# Patient Record
Sex: Male | Born: 1990
Health system: Southern US, Community
[De-identification: ages and names within clinical notes are randomized; demographics above are authoritative.]

## PROBLEM LIST (undated history)

## (undated) DIAGNOSIS — E161 Other hypoglycemia: Secondary | ICD-10-CM

## (undated) DIAGNOSIS — F419 Anxiety disorder, unspecified: Secondary | ICD-10-CM

## (undated) DIAGNOSIS — G43109 Migraine with aura, not intractable, without status migrainosus: Secondary | ICD-10-CM

## (undated) DIAGNOSIS — I10 Essential (primary) hypertension: Secondary | ICD-10-CM

## (undated) DIAGNOSIS — F32A Depression, unspecified: Secondary | ICD-10-CM

## (undated) DIAGNOSIS — M109 Gout, unspecified: Secondary | ICD-10-CM

## (undated) DIAGNOSIS — J45909 Unspecified asthma, uncomplicated: Secondary | ICD-10-CM

## (undated) DIAGNOSIS — K219 Gastro-esophageal reflux disease without esophagitis: Secondary | ICD-10-CM

## (undated) DIAGNOSIS — F329 Major depressive disorder, single episode, unspecified: Secondary | ICD-10-CM

## (undated) HISTORY — DX: Anxiety disorder, unspecified: F41.9

## (undated) HISTORY — DX: Depression, unspecified: F32.A

## (undated) HISTORY — PX: TONSILLECTOMY: SUR1361

## (undated) HISTORY — DX: Other hypoglycemia: E16.1

## (undated) HISTORY — DX: Morbid (severe) obesity due to excess calories: E66.01

## (undated) HISTORY — DX: Unspecified asthma, uncomplicated: J45.909

## (undated) HISTORY — DX: Migraine with aura, not intractable, without status migrainosus: G43.109

## (undated) HISTORY — PX: WISDOM TOOTH EXTRACTION: SHX21

---
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
## 2012-10-25 ENCOUNTER — Encounter: Payer: Self-pay | Admitting: *Deleted

## 2012-10-26 ENCOUNTER — Encounter: Payer: Self-pay | Admitting: Nurse Practitioner

## 2012-10-26 ENCOUNTER — Ambulatory Visit (INDEPENDENT_AMBULATORY_CARE_PROVIDER_SITE_OTHER): Payer: BC Managed Care – PPO | Admitting: Nurse Practitioner

## 2012-10-26 VITALS — BP 130/80 | HR 70 | Ht 72.0 in | Wt 338.0 lb

## 2012-10-26 DIAGNOSIS — F418 Other specified anxiety disorders: Secondary | ICD-10-CM

## 2012-10-26 DIAGNOSIS — E161 Other hypoglycemia: Secondary | ICD-10-CM

## 2012-10-26 DIAGNOSIS — F341 Dysthymic disorder: Secondary | ICD-10-CM

## 2012-10-26 MED ORDER — ESCITALOPRAM OXALATE 10 MG PO TABS: 10.0000 mg | ORAL_TABLET | Freq: Every day | ORAL | Status: DC

## 2012-10-26 MED ORDER — METFORMIN HCL 500 MG PO TABS: 500.0000 mg | ORAL_TABLET | Freq: Two times a day (BID) | ORAL | Status: DC

## 2012-10-30 ENCOUNTER — Encounter: Payer: Self-pay | Admitting: Nurse Practitioner

## 2012-10-30 DIAGNOSIS — F418 Other specified anxiety disorders: Secondary | ICD-10-CM | POA: Insufficient documentation

## 2012-10-30 DIAGNOSIS — F419 Anxiety disorder, unspecified: Secondary | ICD-10-CM | POA: Insufficient documentation

## 2012-10-30 DIAGNOSIS — F32A Depression, unspecified: Secondary | ICD-10-CM | POA: Insufficient documentation

## 2012-10-30 DIAGNOSIS — E161 Other hypoglycemia: Secondary | ICD-10-CM | POA: Insufficient documentation

## 2012-10-30 NOTE — Assessment & Plan Note (Signed)
Restart metformin as directed. Discussed importance of weight loss and activity.

## 2012-10-30 NOTE — Progress Notes (Signed)
Subjective:  Presents with his brother for complaints of fatigue for the past few months. Sleeping 8-9 hours a night. Hypersomnia at times. Emotional lability. Some social isolation. Limited exercise/activity. Denies any alcohol or drug use. Diet is not very healthy overall. Would like some assistance in helping him lose weight. Has a history of hyperinsulinemia. Denies suicidal thoughts or ideation. No evidence of sleep apnea.  Objective:   BP 130/80  Pulse 70  Ht 6' (1.829 m)  Wt 338 lb (153.316 kg)  BMI 45.83 kg/m2 NAD. Alert, oriented. Lungs clear. Heart regular rate rhythm. Thyroid normal limit to palpation and nontender. Large waist circumference.  Assessment:Hyperinsulinemia  Depression with anxiety  Morbid obesity  Plan: Meds ordered this encounter  Medications  . escitalopram (LEXAPRO) 10 MG tablet    Sig: Take 1 tablet (10 mg total) by mouth daily.    Dispense:  30 tablet    Refill:  0    Order Specific Question:  Supervising Provider    Answer:  Merlyn Albert [[id number]]  . metFORMIN (GLUCOPHAGE) 500 MG tablet    Sig: Take 1 tablet (500 mg total) by mouth 2 (two) times daily with a meal.    Dispense:  60 tablet    Refill:  5    Order Specific Question:  Supervising Provider    Answer:  Merlyn Albert [[id number]]   discussed importance of regular exercise. Explained rationale for metformin and how this may help him with his efforts at weight loss. Patient defers mental health counseling at this point. Recheck in 3-4 weeks, call back sooner if any problems.

## 2012-10-30 NOTE — Assessment & Plan Note (Signed)
Start Lexapro 10 mg daily. Defers mental health counseling.

## 2012-11-27 ENCOUNTER — Ambulatory Visit: Payer: BC Managed Care – PPO | Admitting: Family Medicine

## 2012-11-30 ENCOUNTER — Encounter: Payer: Self-pay | Admitting: Nurse Practitioner

## 2012-11-30 ENCOUNTER — Ambulatory Visit (INDEPENDENT_AMBULATORY_CARE_PROVIDER_SITE_OTHER): Payer: BC Managed Care – PPO | Admitting: Nurse Practitioner

## 2012-11-30 VITALS — BP 124/90 | HR 80 | Ht 72.0 in | Wt 331.0 lb

## 2012-11-30 DIAGNOSIS — F341 Dysthymic disorder: Secondary | ICD-10-CM

## 2012-11-30 DIAGNOSIS — F418 Other specified anxiety disorders: Secondary | ICD-10-CM

## 2012-11-30 DIAGNOSIS — E161 Other hypoglycemia: Secondary | ICD-10-CM

## 2012-11-30 MED ORDER — ESCITALOPRAM OXALATE 20 MG PO TABS: 20.0000 mg | ORAL_TABLET | Freq: Every day | ORAL | Status: DC

## 2012-12-03 ENCOUNTER — Ambulatory Visit (HOSPITAL_COMMUNITY): Payer: BC Managed Care – PPO | Admitting: Psychiatry

## 2012-12-04 ENCOUNTER — Encounter: Payer: Self-pay | Admitting: Nurse Practitioner

## 2012-12-04 NOTE — Assessment & Plan Note (Signed)
ncrease Lexapro to 20 mg daily. Given information on local mental health counselor, strongly encouraged to seek counseling. Continue metformin as directed. Continue his weight loss efforts. Increase activity. Recheck in 3 months, call back sooner if any problems.   

## 2012-12-04 NOTE — Assessment & Plan Note (Signed)
ncrease Lexapro to 20 mg daily. Given information on local mental health counselor, strongly encouraged to seek counseling. Continue metformin as directed. Continue his weight loss efforts. Increase activity. Recheck in 3 months, call back sooner if any problems.

## 2012-12-04 NOTE — Progress Notes (Signed)
Subjective:  Presents for followup. Has seen significant improvement in his previous symptoms on Lexapro 10 mg daily. Still having some anger issues at times. Also some social awkwardness and anxiety. Denies suicidal or homicidal thoughts or ideation. No major side effects on Lexapro. Has been taking his metformin as directed. Doing well with his weight loss.  Objective:   BP 124/90  Pulse 80  Ht 6' (1.829 m)  Wt 331 lb (150.141 kg)  BMI 44.88 kg/m2 NAD. Alert, oriented. Thoughts logical coherent and relevant. Calm affect. Lungs clear. Heart regular rate rhythm.  Assessment:Depression with anxiety  Hyperinsulinemia  Morbid obesity  Plan: Increase Lexapro to 20 mg daily. Given information on local mental health counselor, strongly encouraged to seek counseling. Continue metformin as directed. Continue his weight loss efforts. Increase activity. Recheck in 3 months, call back sooner if any problems.

## 2012-12-06 ENCOUNTER — Ambulatory Visit (HOSPITAL_COMMUNITY): Payer: BC Managed Care – PPO | Admitting: Psychiatry

## 2013-07-11 ENCOUNTER — Encounter: Payer: Self-pay | Admitting: Family Medicine

## 2013-07-11 ENCOUNTER — Ambulatory Visit (INDEPENDENT_AMBULATORY_CARE_PROVIDER_SITE_OTHER): Payer: PRIVATE HEALTH INSURANCE | Admitting: Family Medicine

## 2013-07-11 VITALS — BP 138/98 | Ht 72.5 in | Wt 331.0 lb

## 2013-07-11 DIAGNOSIS — M549 Dorsalgia, unspecified: Secondary | ICD-10-CM

## 2013-07-11 MED ORDER — CHLORZOXAZONE 500 MG PO TABS: 500.0000 mg | ORAL_TABLET | Freq: Three times a day (TID) | ORAL | Status: DC

## 2013-07-11 MED ORDER — ETODOLAC 400 MG PO TABS: 400.0000 mg | ORAL_TABLET | Freq: Two times a day (BID) | ORAL | Status: DC

## 2013-07-11 NOTE — Patient Instructions (Signed)
Back Exercises Back exercises help treat and prevent back injuries. The goal of back exercises is to increase the strength of your abdominal and back muscles and the flexibility of your back. These exercises should be started when you no longer have back pain. Back exercises include:  Pelvic Tilt. Lie on your back with your knees bent. Tilt your pelvis until the lower part of your back is against the floor. Hold this position 5 to 10 sec and repeat 5 to 10 times.  Knee to Chest. Pull first 1 knee up against your chest and hold for 20 to 30 seconds, repeat this with the other knee, and then both knees. This may be done with the other leg straight or bent, whichever feels better.  Sit-Ups or Curl-Ups. Bend your knees 90 degrees. Start with tilting your pelvis, and do a partial, slow sit-up, lifting your trunk only 30 to 45 degrees off the floor. Take at least 2 to 3 seconds for each sit-up. Do not do sit-ups with your knees out straight. If partial sit-ups are difficult, simply do the above but with only tightening your abdominal muscles and holding it as directed.  Hip-Lift. Lie on your back with your knees flexed 90 degrees. Push down with your feet and shoulders as you raise your hips a couple inches off the floor; hold for 10 seconds, repeat 5 to 10 times.  Back arches. Lie on your stomach, propping yourself up on bent elbows. Slowly press on your hands, causing an arch in your low back. Repeat 3 to 5 times. Any initial stiffness and discomfort should lessen with repetition over time.  Shoulder-Lifts. Lie face down with arms beside your body. Keep hips and torso pressed to floor as you slowly lift your head and shoulders off the floor. Do not overdo your exercises, especially in the beginning. Exercises may cause you some mild back discomfort which lasts for a few minutes; however, if the pain is more severe, or lasts for more than 15 minutes, do not continue exercises until you see your caregiver.  Improvement with exercise therapy for back problems is slow.  See your caregivers for assistance with developing a proper back exercise program. Document Released: 07/14/2004 Document Revised: 08/29/2011 Document Reviewed: 04/07/2011 [employer] Patient Information 2014 [employer].  

## 2013-07-11 NOTE — Progress Notes (Signed)
   Subjective:    Patient ID: Jason [name], male    DOB: 04/21/1991, 23 y.o.   MRN: [id number]  Back Pain This is a new problem. The current episode started 1 to 4 weeks ago. The quality of the pain is described as shooting and aching. The pain does not radiate. Exacerbated by: walking. Stiffness is present in the morning. Treatments tried: tylenol. The treatment provided no relief.    Mid and lowere back worse on left  Day and night pain, bothersome,  Locking up, sudden spasms,,  Some radiation, shoots down, limps with it  Pos pain at night  Tylenol ibuprofen  Mo/s insurance    Review of Systems  Musculoskeletal: Positive for back pain.   no change in urinary or bowel habits no blood in stool ROS otherwise negative     Objective:   Physical Exam Alert and no major distress lungs clear. Heart regular rate rhythm. Left lower lumbar region tender to deep palpation negative straight leg raise. Spine nontender. Decent range of motion.       Assessment & Plan:  Impression subacute lumbar strain discussed at length plan anti-inflammatory medicine prescribed muscle spasm medicine prescribed local measures discussed. Exercise long-term encourage. WSL

## 2013-09-16 ENCOUNTER — Emergency Department (HOSPITAL_COMMUNITY): Payer: PRIVATE HEALTH INSURANCE

## 2013-09-16 ENCOUNTER — Encounter (HOSPITAL_COMMUNITY): Payer: Self-pay | Admitting: Emergency Medicine

## 2013-09-16 ENCOUNTER — Emergency Department (HOSPITAL_COMMUNITY)
Admission: EM | Admit: 2013-09-16 | Discharge: 2013-09-16 | Disposition: A | Discharge status: Home / Self Care | Payer: PRIVATE HEALTH INSURANCE | Attending: Emergency Medicine | Admitting: Emergency Medicine

## 2013-09-16 DIAGNOSIS — Z8679 Personal history of other diseases of the circulatory system: Secondary | ICD-10-CM | POA: Insufficient documentation

## 2013-09-16 DIAGNOSIS — M25569 Pain in unspecified knee: Secondary | ICD-10-CM | POA: Insufficient documentation

## 2013-09-16 DIAGNOSIS — M25561 Pain in right knee: Secondary | ICD-10-CM

## 2013-09-16 DIAGNOSIS — J45909 Unspecified asthma, uncomplicated: Secondary | ICD-10-CM | POA: Insufficient documentation

## 2013-09-16 DIAGNOSIS — M25469 Effusion, unspecified knee: Secondary | ICD-10-CM | POA: Insufficient documentation

## 2013-09-16 MED ORDER — HYDROCODONE-ACETAMINOPHEN 5-325 MG PO TABS
2.0000 | ORAL_TABLET | Freq: Once | ORAL | Status: AC
  Administered 2013-09-16: 2 via ORAL
  Filled 2013-09-16: qty 2

## 2013-09-16 MED ORDER — KETOROLAC TROMETHAMINE 10 MG PO TABS
10.0000 mg | ORAL_TABLET | Freq: Once | ORAL | Status: AC
  Administered 2013-09-16: 10 mg via ORAL
  Filled 2013-09-16: qty 1

## 2013-09-16 MED ORDER — DICLOFENAC SODIUM 75 MG PO TBEC: 75.0000 mg | DELAYED_RELEASE_TABLET | Freq: Two times a day (BID) | ORAL | Status: DC

## 2013-09-16 MED ORDER — HYDROCODONE-ACETAMINOPHEN 5-325 MG PO TABS: 1.0000 | ORAL_TABLET | ORAL | Status: DC | PRN

## 2013-09-16 NOTE — ED Notes (Signed)
Pain rt knee, x 3 weeks, No known injury.  Says that he stood up and leg felt like it would "give way".

## 2013-09-16 NOTE — ED Notes (Signed)
Knee pain x 2.5 weeks. Pt states intermittent swelling to knee. No known injury

## 2013-09-16 NOTE — ED Provider Notes (Signed)
CSN: [id number]     Arrival date & time 09/16/13  1303 History   First MD Initiated Contact with Patient 09/16/13 1522  This chart was scribed for non-physician practitioner, Ivery Quale, working with Flint Melter, MD by Marica Otter, ED Scribe. This patient was seen in room APFT21/APFT21 and the patient's care was started at 3:27 PM.  Chief Complaint  Patient presents with  . Knee Pain   The history is provided by the patient. No language interpreter was used.   HPI Comments: Jason [name] is a 23 y.o. male who presents to the Emergency Department complaining of right knee pain onset 2.5 weeks ago when he got home from work and his right leg felt like it would "give way." Pt reports he has not been able to walk without pain since then and putting any pressure on the right knee intensifies the right knee pain. Pt also complains of associated swelling of the right knee. 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 Pt denies any ankle pain. Pt also denies that the knee is hot to touch or any prior surgeries/injuries to the area.    Past Medical History  Diagnosis Date  . Asthma   . Morbid obesity   . Migraine with aura   . Hyperinsulinemia    Past Surgical History  Procedure Laterality Date  . Tonsillectomy     No family history on file. History  Substance Use Topics  . Smoking status: Never Smoker   . Smokeless tobacco: Not on file  . Alcohol Use: No     Comment: Rarely    Review of Systems  Cardiovascular: Positive for leg swelling ( right knee ).  Musculoskeletal: Positive for myalgias ( right knee pain).   A complete 10 system review of systems was obtained and all systems are negative except as noted in the HPI and PMH.   Allergies  Adipex-p  Home Medications  No current outpatient prescriptions on file. BP 146/89  Pulse 87  Temp(Src) 98.2 F (36.8 C) (Oral)  Resp 18  Ht 6' (1.829 m)  Wt 320 lb (145.151 kg)   BMI 43.39 kg/m2  SpO2 100% Physical Exam  Nursing note and vitals reviewed. Constitutional: He is oriented to person, place, and time. He appears well-developed and well-nourished. No distress.  HENT:  Head: Normocephalic and atraumatic.  Eyes: EOM are normal.  Neck: Neck supple. No tracheal deviation present.  Cardiovascular: Normal rate.   Pulmonary/Chest: Effort normal. No respiratory distress.  Musculoskeletal: Normal range of motion.  FROM of the hip. No deformity of the quadriceps. Patella is in the midline. No deformity of the anterior, tibial tuberosity. Soreness of the posterior right knee but no hematoma or mass. No tibial deformity. Achilles tendon on the right is intact. Dorsalis pedis 2+. At 80 degrees ankle there is pain at the lateral and medial right knee. Pain with flexion.    Neurological: He is alert and oriented to person, place, and time.  Skin: Skin is warm and dry.  Psychiatric: He has a normal mood and affect. His behavior is normal.    ED Course  Procedures (including critical care time) DIAGNOSTIC STUDIES: Oxygen Saturation is 100% on RA, normal by my interpretation.    COORDINATION OF CARE:  3:37 PM-Discussed treatment plan, which includes imaging results, possible causes of knee pain, and advising pt to follow up with an orthopedic specialist, orthopedic referral, placement of knee  immobilizer, treating the area with ice packs, resting the knee, antiinflammatory and pain meds with pt at bedside and pt agreed to plan. Pt was offered crutches, however, pt declined.   Labs Review Labs Reviewed - No data to display Imaging Review Dg Knee Complete 4 Views Right  09/16/2013   CLINICAL DATA:  Right knee pain.  EXAM: RIGHT KNEE - COMPLETE 4+ VIEW  COMPARISON:  None.  FINDINGS: There is no evidence of fracture, dislocation, or joint effusion. There is no evidence of arthropathy or other focal bone abnormality. Soft tissues are unremarkable.  IMPRESSION: Normal right  knee.   Electronically Signed   By: Irish LackGlenn  Yamagata M.D.   On: 09/16/2013 13:46     EKG Interpretation None      MDM Patient reports to have weeks of pain involving the right knee. The pain is worse when he is standing. The patient states that he does a lot of standing, pushing, pulling, bending, stooping. He has not had any previous operations or procedures involving the knee. No emergent changes noted involving the knee. Strongly suggested to the patient to be evaluated by orthopedics.    Final diagnoses:  None    *I have reviewed nursing notes, vital signs, and all appropriate lab and imaging results for this patient.** *scribI personally performed the services described in this documentation, which was scribed in my presence. The recorded information has been reviewed and is accurate.e  Jason DikeHobson M Shariya Gaster, PA-C 09/16/13 1615

## 2013-09-16 NOTE — Discharge Instructions (Signed)
The x-ray of your knee is negative for fracture or dislocation or effusion. Please see the orthopedic specialist listed above for additional evaluation concerning your knee. Please rest her knee is much as possible apply ice. Use diclofenac 2 times daily. May use Norco for more severe pain. This medication may cause drowsiness, please use with caution. Knee Pain Knee pain can be a result of an injury or other medical conditions. Treatment will depend on the cause of your pain. HOME CARE  Only take medicine as told by your doctor.  Keep a healthy weight. Being overweight can make the knee hurt more.  Stretch before exercising or playing sports.  If there is constant knee pain, change the way you exercise. Ask your doctor for advice.  Make sure shoes fit well. Choose the right shoe for the sport or activity.  Protect your knees. Wear kneepads if needed.  Rest when you are tired. GET HELP RIGHT AWAY IF:   Your knee pain does not stop.  Your knee pain does not get better.  Your knee joint feels hot to the touch.  You have a fever. MAKE SURE YOU:   Understand these instructions.  Will watch this condition.  Will get help right away if you are not doing well or get worse. Document Released: 09/02/2008 Document Revised: 08/29/2011 Document Reviewed: 09/02/2008 [facility] Patient Information 2014 [place], [place].

## 2013-09-17 NOTE — ED Provider Notes (Signed)
Medical screening examination/treatment/procedure(s) were performed by non-physician practitioner and as supervising physician I was immediately available for consultation/collaboration.  Marco Adelson L Ikia Cincotta, MD 09/17/13 0013 

## 2013-10-16 ENCOUNTER — Other Ambulatory Visit: Payer: Self-pay | Admitting: Orthopedic Surgery

## 2013-10-16 DIAGNOSIS — M25561 Pain in right knee: Secondary | ICD-10-CM

## 2013-10-19 ENCOUNTER — Other Ambulatory Visit: Payer: Self-pay

## 2013-10-27 ENCOUNTER — Ambulatory Visit
Admission: RE | Admit: 2013-10-27 | Discharge: 2013-10-27 | Disposition: A | Discharge status: Home / Self Care | Payer: PRIVATE HEALTH INSURANCE | Source: Ambulatory Visit | Attending: Orthopedic Surgery | Admitting: Orthopedic Surgery

## 2013-10-27 DIAGNOSIS — M25561 Pain in right knee: Secondary | ICD-10-CM

## 2014-01-20 ENCOUNTER — Telehealth: Payer: Self-pay | Admitting: *Deleted

## 2014-01-20 NOTE — Telephone Encounter (Signed)
Open in error

## 2014-02-20 ENCOUNTER — Telehealth: Payer: Self-pay | Admitting: Family Medicine

## 2014-02-20 NOTE — Telephone Encounter (Signed)
Ntsw, reasonable if in distress

## 2014-02-20 NOTE — Telephone Encounter (Signed)
Patients thinks his blood pressure is running high and wants an appointment for tomorrow afternoon.  He has no way to check it because his arms are too big for [employer] cuffs, but he said that he has been having a lot of light headed spells, vision blurry and headaches. Please advise.

## 2014-02-20 NOTE — Telephone Encounter (Signed)
Patient concerned about blood pressure. Patient scheduled office visit for tommorow-ER tonight if worse.

## 2014-02-21 ENCOUNTER — Ambulatory Visit: Payer: PRIVATE HEALTH INSURANCE | Admitting: Family Medicine

## 2014-08-06 ENCOUNTER — Encounter (HOSPITAL_COMMUNITY): Payer: Self-pay

## 2014-08-06 ENCOUNTER — Emergency Department (HOSPITAL_COMMUNITY)
Admission: EM | Admit: 2014-08-06 | Discharge: 2014-08-06 | Disposition: A | Discharge status: Home / Self Care | Payer: PRIVATE HEALTH INSURANCE | Attending: Emergency Medicine | Admitting: Emergency Medicine

## 2014-08-06 DIAGNOSIS — J45909 Unspecified asthma, uncomplicated: Secondary | ICD-10-CM | POA: Insufficient documentation

## 2014-08-06 DIAGNOSIS — R109 Unspecified abdominal pain: Secondary | ICD-10-CM | POA: Diagnosis present

## 2014-08-06 DIAGNOSIS — K529 Noninfective gastroenteritis and colitis, unspecified: Secondary | ICD-10-CM | POA: Diagnosis not present

## 2014-08-06 DIAGNOSIS — Z8679 Personal history of other diseases of the circulatory system: Secondary | ICD-10-CM | POA: Insufficient documentation

## 2014-08-06 DIAGNOSIS — Z791 Long term (current) use of non-steroidal anti-inflammatories (NSAID): Secondary | ICD-10-CM | POA: Insufficient documentation

## 2014-08-06 MED ORDER — ONDANSETRON 8 MG PO TBDP
8.0000 mg | ORAL_TABLET | Freq: Once | ORAL | Status: AC
  Administered 2014-08-06: 8 mg via ORAL
  Filled 2014-08-06: qty 1

## 2014-08-06 MED ORDER — PROMETHAZINE HCL 25 MG PO TABS
25.0000 mg | ORAL_TABLET | Freq: Four times a day (QID) | ORAL | Status: DC | PRN
Start: 2014-08-06 — End: 2015-07-06

## 2014-08-06 MED ORDER — IBUPROFEN 800 MG PO TABS: 800.0000 mg | ORAL_TABLET | Freq: Three times a day (TID) | ORAL | Status: DC

## 2014-08-06 MED ORDER — KETOROLAC TROMETHAMINE 60 MG/2ML IM SOLN
60.0000 mg | Freq: Once | INTRAMUSCULAR | Status: AC
  Administered 2014-08-06: 60 mg via INTRAMUSCULAR
  Filled 2014-08-06: qty 2

## 2014-08-06 MED ORDER — ONDANSETRON HCL 4 MG PO TABS: 4.0000 mg | ORAL_TABLET | Freq: Three times a day (TID) | ORAL | Status: DC | PRN

## 2014-08-06 NOTE — Discharge Instructions (Signed)
Please call your doctor for a followup appointment within 24-48 hours. When you talk to your doctor please let them know that you were seen in the emergency department and have them acquire all of your records so that they can discuss the findings with you and formulate a treatment plan to fully care for your new and ongoing problems. ° °

## 2014-08-06 NOTE — ED Notes (Signed)
Pt states he started having abd pain with vomiting and diarrha approx 1 hour ago, has had 3 diarrhea stools in that period

## 2014-08-06 NOTE — ED Provider Notes (Signed)
CSN: [id number]     Arrival date & time 08/06/14  0120 History   First MD Initiated Contact with Patient 08/06/14 0131     Chief Complaint  Patient presents with  . Abdominal Pain     (Consider location/radiation/quality/duration/timing/severity/associated sxs/prior Treatment) HPI Comments: 24 year old male, presents with a complaint of vomiting diarrhea and abdominal pain that started 1 hour ago. He reports 3 episodes of diarrhea which she describes as both watery and loose and one episode of vomiting. He has associated epigastric cramping. His significant other has the exact same symptoms. Nothing makes this better or worse, not associated with bloody emesis or bloody diarrhea. Not associated with any abnormal food intake, he endorses having a hamburger and french fries for dinner. There has been no complaints of headache, sore throat, cough, shortness of breath, back pain, swelling, rash.  Patient is a 24 y.o. male presenting with abdominal pain. The history is provided by the patient and a relative.  Abdominal Pain   Past Medical History  Diagnosis Date  . Asthma   . Morbid obesity   . Migraine with aura   . Hyperinsulinemia    Past Surgical History  Procedure Laterality Date  . Tonsillectomy     No family history on file. History  Substance Use Topics  . Smoking status: Never Smoker   . Smokeless tobacco: Not on file  . Alcohol Use: No     Comment: Rarely    Review of Systems  Gastrointestinal: Positive for abdominal pain.  All other systems reviewed and are negative.     Allergies  Adipex-p  Home Medications   Prior to Admission medications   Medication Sig Start Date End Date Taking? Authorizing Provider  diclofenac (VOLTAREN) 75 MG EC tablet Take 1 tablet (75 mg total) by mouth 2 (two) times daily. 09/16/13   Kathie DikeHobson M Bryant, PA-C  HYDROcodone-acetaminophen (NORCO/VICODIN) 5-325 MG per tablet Take 1 tablet by mouth every 4 (four) hours as needed for moderate  pain. 09/16/13   Kathie DikeHobson M Bryant, PA-C  ibuprofen (ADVIL,MOTRIN) 800 MG tablet Take 1 tablet (800 mg total) by mouth 3 (three) times daily. 08/06/14   Vida RollerBrian D Pammy Vesey, MD  ondansetron (ZOFRAN) 4 MG tablet Take 1 tablet (4 mg total) by mouth every 8 (eight) hours as needed for nausea or vomiting. 08/06/14   Vida RollerBrian D Yazmine Sorey, MD  promethazine (PHENERGAN) 25 MG tablet Take 1 tablet (25 mg total) by mouth every 6 (six) hours as needed for nausea or vomiting. 08/06/14   Vida RollerBrian D Delitha Elms, MD   BP 131/76 mmHg  Pulse 77  Temp(Src) 98 F (36.7 C) (Oral)  Resp 20  Ht 6\' 4"  (1.93 m)  Wt 330 lb (149.687 kg)  BMI 40.19 kg/m2  SpO2 97% Physical Exam  Constitutional: He appears well-developed and well-nourished. No distress.  HENT:  Head: Normocephalic and atraumatic.  Mouth/Throat: Oropharynx is clear and moist. No oropharyngeal exudate.  Eyes: Conjunctivae and EOM are normal. Pupils are equal, round, and reactive to light. Right eye exhibits no discharge. Left eye exhibits no discharge. No scleral icterus.  Neck: Normal range of motion. Neck supple. No JVD present. No thyromegaly present.  Cardiovascular: Normal rate, regular rhythm, normal heart sounds and intact distal pulses.  Exam reveals no gallop and no friction rub.   No murmur heard. Pulmonary/Chest: Effort normal and breath sounds normal. No respiratory distress. He has no wheezes. He has no rales.  Abdominal: Soft. Bowel sounds are normal. He exhibits no distension and  no mass. There is tenderness ( Minimal epigastric tenderness).  Musculoskeletal: Normal range of motion. He exhibits no edema or tenderness.  Lymphadenopathy:    He has no cervical adenopathy.  Neurological: He is alert. Coordination normal.  Skin: Skin is warm and dry. No rash noted. No erythema.  Psychiatric: He has a normal mood and affect. His behavior is normal.  Nursing note and vitals reviewed.   ED Course  Procedures (including critical care time) Labs Review Labs  Reviewed - No data to display  Imaging Review No results found.    MDM   Final diagnoses:  Gastroenteritis    The patient's symptoms are almost exactly the same as his significant others. They have not had any suspicious food intake though they did ate the same dinner 6 hours prior to symptoms. This could just as easily be a stomach virus, overall the patient appears stable, he will receive intramuscular Toradol and oral Zofran, stable for discharge. He has been informed that his symptoms will likely last a couple of days.   Pt given meds, stable for d/c, can f/u outpt.  Encouraged BRAT diet and ongoing hydration  Meds given in ED:  Medications  ketorolac (TORADOL) injection 60 mg (60 mg Intramuscular Given 08/06/14 0152)  ondansetron (ZOFRAN-ODT) disintegrating tablet 8 mg (8 mg Oral Given 08/06/14 0152)    New Prescriptions   IBUPROFEN (ADVIL,MOTRIN) 800 MG TABLET    Take 1 tablet (800 mg total) by mouth 3 (three) times daily.   ONDANSETRON (ZOFRAN) 4 MG TABLET    Take 1 tablet (4 mg total) by mouth every 8 (eight) hours as needed for nausea or vomiting.   PROMETHAZINE (PHENERGAN) 25 MG TABLET    Take 1 tablet (25 mg total) by mouth every 6 (six) hours as needed for nausea or vomiting.        Vida Roller, MD 08/06/14 [phone]

## 2014-11-05 ENCOUNTER — Encounter: Payer: Self-pay | Admitting: Family Medicine

## 2014-11-05 ENCOUNTER — Ambulatory Visit (INDEPENDENT_AMBULATORY_CARE_PROVIDER_SITE_OTHER): Payer: PRIVATE HEALTH INSURANCE | Admitting: Family Medicine

## 2014-11-05 VITALS — BP 124/84 | Ht 76.0 in | Wt 353.0 lb

## 2014-11-05 DIAGNOSIS — L989 Disorder of the skin and subcutaneous tissue, unspecified: Secondary | ICD-10-CM

## 2014-11-05 DIAGNOSIS — R21 Rash and other nonspecific skin eruption: Secondary | ICD-10-CM | POA: Diagnosis not present

## 2014-11-05 NOTE — Progress Notes (Signed)
   Subjective:    Patient ID: Jason [name], male    DOB: 04/25/1991, 24 y.o.   MRN: [id number]  HPI  Patient here for pain in upper left arm where a knot has been for several years. Patient also concerned about a second knot on lower left arm.   progressively larger lump, sens to the sun  Also addtnl knowt has cropped u  Hx of melanoma in the in laws so pt uptight about it    Patient also concerned about not urinating frequently although he drinks often. Long term tendency. Drinks more water than usual, started , rekatively light, notes no noctutria   [phone] Review of Systems No headache no chest pain no back pain no abdominal pain no change in bowel habits    Objective:   Physical Exam  Alert no acute distress vital stable left upper arm small probable dermatofibroma. Left forearm cyst noted      Assessment & Plan:  Impression multiple skin lesions of concern to patient. Likely benign discussed #2 diminished urinary frequency likely within normal limits plan patient to bring back urinalysis. Dermatology referral. Maceo ProWSL

## 2014-11-06 ENCOUNTER — Telehealth: Payer: Self-pay | Admitting: *Deleted

## 2014-11-06 NOTE — Telephone Encounter (Signed)
Pt seen by Dr.Steve on 5/18. Was unable to get a urine specimen. Pt to bring back specimen and bring back to office. Dip and spin. Pt's number is [phone]

## 2014-11-10 ENCOUNTER — Telehealth: Payer: Self-pay

## 2014-11-10 NOTE — Telephone Encounter (Signed)
See telephone message on 11/06/14. Patient dropped off urine. Urine has been dipped and spun. PH- 6.0, SG- 1.025. Everything else was negative.

## 2014-11-11 NOTE — Telephone Encounter (Signed)
Notify pt urine fine

## 2014-11-11 NOTE — Telephone Encounter (Signed)
[facility] (voicemail not setup), card mailed notifying patient of results

## 2014-12-03 ENCOUNTER — Encounter (HOSPITAL_COMMUNITY): Payer: Self-pay

## 2014-12-03 ENCOUNTER — Emergency Department (HOSPITAL_COMMUNITY)
Admission: EM | Admit: 2014-12-03 | Discharge: 2014-12-03 | Disposition: A | Discharge status: Home / Self Care | Payer: PRIVATE HEALTH INSURANCE | Attending: Emergency Medicine | Admitting: Emergency Medicine

## 2014-12-03 DIAGNOSIS — B341 Enterovirus infection, unspecified: Secondary | ICD-10-CM | POA: Diagnosis not present

## 2014-12-03 DIAGNOSIS — Z791 Long term (current) use of non-steroidal anti-inflammatories (NSAID): Secondary | ICD-10-CM | POA: Insufficient documentation

## 2014-12-03 DIAGNOSIS — R21 Rash and other nonspecific skin eruption: Secondary | ICD-10-CM | POA: Diagnosis present

## 2014-12-03 DIAGNOSIS — Z8679 Personal history of other diseases of the circulatory system: Secondary | ICD-10-CM | POA: Insufficient documentation

## 2014-12-03 DIAGNOSIS — J45909 Unspecified asthma, uncomplicated: Secondary | ICD-10-CM | POA: Insufficient documentation

## 2014-12-03 DIAGNOSIS — J029 Acute pharyngitis, unspecified: Secondary | ICD-10-CM | POA: Insufficient documentation

## 2014-12-03 NOTE — ED Provider Notes (Signed)
CSN: [id number]     Arrival date & time 12/03/14  1853 History   First MD Initiated Contact with Patient 12/03/14 1919     Chief Complaint  Patient presents with  . Rash     (Consider location/radiation/quality/duration/timing/severity/associated sxs/prior Treatment) Patient is a 24 y.o. male presenting with rash. The history is provided by the patient.  Rash Location:  Mouth, hand and foot Mouth rash location:  Tongue, upper gingiva and lower gingiva Hand rash location:  R hand and L hand Foot rash location:  L foot and R foot Quality: redness   Severity:  Moderate Onset quality:  Gradual Duration:  2 days Timing:  Constant Progression:  Worsening Chronicity:  New Relieved by:  Nothing Worsened by:  Nothing tried Ineffective treatments:  None tried Associated symptoms: sore throat    Jason [name] is a 24 y.o. male who presents to the ED with a rash to his hand and feet and in his mouth. He denies any other problems. He states that a couple weeks ago a kid in the neighborhood had hand, foot, mouth disease but he did not have really close contact with him.   Past Medical History  Diagnosis Date  . Asthma   . Morbid obesity   . Migraine with aura   . Hyperinsulinemia    Past Surgical History  Procedure Laterality Date  . Tonsillectomy     History reviewed. No pertinent family history. History  Substance Use Topics  . Smoking status: Never Smoker   . Smokeless tobacco: Never Used  . Alcohol Use: 0.0 oz/week    0 Standard drinks or equivalent per week     Comment: occasional    Review of Systems  HENT: Positive for sore throat.   Skin: Positive for rash.  all other systems negative    Allergies  Adipex-p  Home Medications   Prior to Admission medications   Medication Sig Start Date End Date Taking? Authorizing Provider  diclofenac (VOLTAREN) 75 MG EC tablet Take 1 tablet (75 mg total) by mouth 2 (two) times daily. 09/16/13   Ivery Quale, PA-C   HYDROcodone-acetaminophen (NORCO/VICODIN) 5-325 MG per tablet Take 1 tablet by mouth every 4 (four) hours as needed for moderate pain. Patient not taking: Reported on 11/05/2014 09/16/13   Ivery Quale, PA-C  ibuprofen (ADVIL,MOTRIN) 800 MG tablet Take 1 tablet (800 mg total) by mouth 3 (three) times daily. 08/06/14   Eber Hong, MD  ondansetron (ZOFRAN) 4 MG tablet Take 1 tablet (4 mg total) by mouth every 8 (eight) hours as needed for nausea or vomiting. 08/06/14   Eber Hong, MD  promethazine (PHENERGAN) 25 MG tablet Take 1 tablet (25 mg total) by mouth every 6 (six) hours as needed for nausea or vomiting. 08/06/14   Eber Hong, MD   BP 141/81 mmHg  Pulse 90  Temp(Src) 98.1 F (36.7 C) (Oral)  Resp 24  Ht 6' (1.829 m)  Wt 330 lb (149.687 kg)  BMI 44.75 kg/m2  SpO2 100% Physical Exam  Constitutional: He is oriented to person, place, and time. He appears well-developed and well-nourished. No distress.  HENT:  Head: Normocephalic.  Mouth/Throat: Uvula is midline.  Ulcer lesions to the posterior pharynx and soft palate. Few areas on tongue.   Eyes: EOM are normal.  Neck: Neck supple.  Cardiovascular: Normal rate.   Pulmonary/Chest: Effort normal.  Musculoskeletal: Normal range of motion.  See skin exam  Lymphadenopathy:    He has cervical adenopathy.  Neurological: He  is alert and oriented to person, place, and time. No cranial nerve deficit.  Skin: Skin is warm and dry.  Small red circular lesions plantar aspect of feet and right hand, few areas left hand.   Psychiatric: He has a normal mood and affect. His behavior is normal.  Nursing note and vitals reviewed.   ED Course  Procedures (including critical care time) Dr. Juleen China in to examine the patient and agrees viral rash most consistent with Coxsackie virus  Labs Review  MDM  24 y.o. male with rash to hands, feet and oral lesions. Stable for d/c without fever and does not appear toxic. Discussed with the patient  clinical findings and plan of care. All questioned fully answered. He will return if any problems arise.   Final diagnoses:  Coxsackie virus infection      Janne Napoleon, NP 12/04/14 0113  Raeford Razor, MD 12/05/14 1055

## 2014-12-03 NOTE — ED Provider Notes (Signed)
Medical screening examination/treatment/procedure(s) were conducted as a shared visit with non-physician practitioner(s) and myself.  I personally evaluated the patient during the encounter.   EKG Interpretation None     23yM with what I suspect is hand, foot & mouth although I have never personally seen in a patient this age. Erythematous papules to b/l feet, R hand and some soft palate lesions. Typical time of year.   Appears well. Low suspicion for emergent process. Symptomatic tx at this time.   Raeford Razor, MD 12/03/14 [phone]

## 2014-12-03 NOTE — ED Notes (Signed)
Patient c/o rash to feet, hands and mouth with pain

## 2014-12-03 NOTE — Discharge Instructions (Signed)
Take tylenol and ibuprofen for pain. Use salt water gargles and use Chloraseptic spray as needed for pain. Return as needed for worsening symptoms

## 2014-12-17 ENCOUNTER — Emergency Department (HOSPITAL_COMMUNITY)
Admission: EM | Admit: 2014-12-17 | Discharge: 2014-12-17 | Disposition: A | Discharge status: Home / Self Care | Payer: PRIVATE HEALTH INSURANCE | Attending: Emergency Medicine | Admitting: Emergency Medicine

## 2014-12-17 ENCOUNTER — Encounter (HOSPITAL_COMMUNITY): Payer: Self-pay

## 2014-12-17 ENCOUNTER — Emergency Department (HOSPITAL_COMMUNITY): Payer: PRIVATE HEALTH INSURANCE

## 2014-12-17 DIAGNOSIS — Z8679 Personal history of other diseases of the circulatory system: Secondary | ICD-10-CM | POA: Insufficient documentation

## 2014-12-17 DIAGNOSIS — J45909 Unspecified asthma, uncomplicated: Secondary | ICD-10-CM | POA: Diagnosis not present

## 2014-12-17 DIAGNOSIS — Y92410 Unspecified street and highway as the place of occurrence of the external cause: Secondary | ICD-10-CM | POA: Diagnosis not present

## 2014-12-17 DIAGNOSIS — Y9339 Activity, other involving climbing, rappelling and jumping off: Secondary | ICD-10-CM | POA: Diagnosis not present

## 2014-12-17 DIAGNOSIS — Y998 Other external cause status: Secondary | ICD-10-CM | POA: Diagnosis not present

## 2014-12-17 DIAGNOSIS — Z7901 Long term (current) use of anticoagulants: Secondary | ICD-10-CM | POA: Insufficient documentation

## 2014-12-17 DIAGNOSIS — S99912A Unspecified injury of left ankle, initial encounter: Secondary | ICD-10-CM | POA: Diagnosis present

## 2014-12-17 DIAGNOSIS — S93402A Sprain of unspecified ligament of left ankle, initial encounter: Secondary | ICD-10-CM | POA: Insufficient documentation

## 2014-12-17 MED ORDER — HYDROCODONE-ACETAMINOPHEN 5-325 MG PO TABS: 1.0000 | ORAL_TABLET | ORAL | Status: DC | PRN

## 2014-12-17 MED ORDER — KETOROLAC TROMETHAMINE 10 MG PO TABS
10.0000 mg | ORAL_TABLET | Freq: Once | ORAL | Status: AC
Start: 2014-12-17 — End: 2014-12-17
  Administered 2014-12-17: 10 mg via ORAL
  Filled 2014-12-17: qty 1

## 2014-12-17 MED ORDER — MELOXICAM 15 MG PO TABS: 15.0000 mg | ORAL_TABLET | Freq: Every day | ORAL | Status: DC

## 2014-12-17 NOTE — Discharge Instructions (Signed)
Your x-ray is negative for fracture, however there seems to be some fluid in the joint. Please see Dr. Romeo AppleHarrison, or the orthopedic specialist of your choice as sone as possible for additional orthopedic evaluation of this finding. Please keep your foot elevated above your waist. Please apply ice. Please use crutches until you can safely apply weight to your lower extremity. Please use of mobile daily with food. May use Norco for more severe pain if needed. Norco may cause drowsiness, please do not operate machinery, drink alcohol, operate a vehicle, or but is patent in activities that require concentration while taking this medication. Ankle Sprain An ankle sprain is an injury to the strong, fibrous tissues (ligaments) that hold the bones of your ankle joint together.  CAUSES An ankle sprain is usually caused by a fall or by twisting your ankle. Ankle sprains most commonly occur when you step on the outer edge of your foot, and your ankle turns inward. People who participate in sports are more prone to these types of injuries.  SYMPTOMS   Pain in your ankle. The pain may be present at rest or only when you are trying to stand or walk.  Swelling.  Bruising. Bruising may develop immediately or within 1 to 2 days after your injury.  Difficulty standing or walking, particularly when turning corners or changing directions. DIAGNOSIS  Your caregiver will ask you details about your injury and perform a physical exam of your ankle to determine if you have an ankle sprain. During the physical exam, your caregiver will press on and apply pressure to specific areas of your foot and ankle. Your caregiver will try to move your ankle in certain ways. An X-ray exam may be done to be sure a bone was not broken or a ligament did not separate from one of the bones in your ankle (avulsion fracture).  TREATMENT  Certain types of braces can help stabilize your ankle. Your caregiver can make a recommendation for this.  Your caregiver may recommend the use of medicine for pain. If your sprain is severe, your caregiver may refer you to a surgeon who helps to restore function to parts of your skeletal system (orthopedist) or a physical therapist. HOME CARE INSTRUCTIONS   Apply ice to your injury for 1-2 days or as directed by your caregiver. Applying ice helps to reduce inflammation and pain.  Put ice in a plastic bag.  Place a towel between your skin and the bag.  Leave the ice on for 15-20 minutes at a time, every 2 hours while you are awake.  Only take over-the-counter or prescription medicines for pain, discomfort, or fever as directed by your caregiver.  Elevate your injured ankle above the level of your heart as much as possible for 2-3 days.  If your caregiver recommends crutches, use them as instructed. Gradually put weight on the affected ankle. Continue to use crutches or a cane until you can walk without feeling pain in your ankle.  If you have a plaster splint, wear the splint as directed by your caregiver. Do not rest it on anything harder than a pillow for the first 24 hours. Do not put weight on it. Do not get it wet. You may take it off to take a shower or bath.  You may have been given an elastic bandage to wear around your ankle to provide support. If the elastic bandage is too tight (you have numbness or tingling in your foot or your foot becomes cold and  blue), adjust the bandage to make it comfortable.  If you have an air splint, you may blow more air into it or let air out to make it more comfortable. You may take your splint off at night and before taking a shower or bath. Wiggle your toes in the splint several times per day to decrease swelling. SEEK MEDICAL CARE IF:   You have rapidly increasing bruising or swelling.  Your toes feel extremely cold or you lose feeling in your foot.  Your pain is not relieved with medicine. SEEK IMMEDIATE MEDICAL CARE IF:  Your toes are numb or  blue.  You have severe pain that is increasing. MAKE SURE YOU:   Understand these instructions.  Will watch your condition.  Will get help right away if you are not doing well or get worse. Document Released: 06/06/2005 Document Revised: 02/29/2012 Document Reviewed: 06/18/2011 [facility] Patient Information 2015 [place], [place]. This information is not intended to replace advice given to you by your health care provider. Make sure you discuss any questions you have with your health care provider.

## 2014-12-17 NOTE — ED Notes (Signed)
Pt reports 3 weeks ago he jumped off of the back of a truck and hurt left ankle.  C/O pain and  Swelling since then.

## 2014-12-17 NOTE — ED Provider Notes (Signed)
CSN: [id number]     Arrival date & time 12/17/14  [id number] History   First MD Initiated Contact with Patient 12/17/14 0945     Chief Complaint  Patient presents with  . Ankle Pain     (Consider location/radiation/quality/duration/timing/severity/associated sxs/prior Treatment) Patient is a 24 y.o. male presenting with ankle pain. The history is provided by the patient.  Ankle Pain Location:  Hip and ankle Time since incident:  3 weeks Injury: yes   Mechanism of injury comment:  Jumped off the back of a truck Ankle location:  L ankle Pain details:    Quality:  Aching   Severity:  Moderate   Duration:  3 weeks   Timing:  Intermittent   Progression:  Worsening Chronicity:  New Dislocation: no   Foreign body present:  No foreign bodies Prior injury to area:  No Relieved by:  Nothing Worsened by:  Bearing weight Ineffective treatments:  Acetaminophen Associated symptoms: swelling   Associated symptoms: no numbness and no tingling   Risk factors: no frequent fractures and no known bone disorder     Past Medical History  Diagnosis Date  . Asthma   . Morbid obesity   . Migraine with aura   . Hyperinsulinemia    Past Surgical History  Procedure Laterality Date  . Tonsillectomy     No family history on file. History  Substance Use Topics  . Smoking status: Never Smoker   . Smokeless tobacco: Never Used  . Alcohol Use: 0.0 oz/week    0 Standard drinks or equivalent per week     Comment: occasional    Review of Systems  Musculoskeletal: Positive for arthralgias.  All other systems reviewed and are negative.     Allergies  Adipex-p  Home Medications   Prior to Admission medications   Medication Sig Start Date End Date Taking? Authorizing Provider  diclofenac (VOLTAREN) 75 MG EC tablet Take 1 tablet (75 mg total) by mouth 2 (two) times daily. 09/16/13   Ivery QualeHobson Jaylia Pettus, PA-C  HYDROcodone-acetaminophen (NORCO/VICODIN) 5-325 MG per tablet Take 1 tablet by mouth every 4  (four) hours as needed. 12/17/14   Ivery QualeHobson Maher Shon, PA-C  ibuprofen (ADVIL,MOTRIN) 800 MG tablet Take 1 tablet (800 mg total) by mouth 3 (three) times daily. 08/06/14   Eber HongBrian Miller, MD  meloxicam (MOBIC) 15 MG tablet Take 1 tablet (15 mg total) by mouth daily. 12/17/14   Ivery QualeHobson Dayanara Sherrill, PA-C  ondansetron (ZOFRAN) 4 MG tablet Take 1 tablet (4 mg total) by mouth every 8 (eight) hours as needed for nausea or vomiting. 08/06/14   Eber HongBrian Miller, MD  promethazine (PHENERGAN) 25 MG tablet Take 1 tablet (25 mg total) by mouth every 6 (six) hours as needed for nausea or vomiting. 08/06/14   Eber HongBrian Miller, MD   BP 136/86 mmHg  Pulse 86  Temp(Src) 97.7 F (36.5 C) (Oral)  Resp 14  Ht 6' (1.829 m)  Wt 325 lb (147.419 kg)  BMI 44.07 kg/m2  SpO2 100% Physical Exam  Constitutional: He is oriented to person, place, and time. He appears well-developed and well-nourished.  Non-toxic appearance.  HENT:  Head: Normocephalic.  Right Ear: Tympanic membrane and external ear normal.  Left Ear: Tympanic membrane and external ear normal.  Eyes: EOM and lids are normal. Pupils are equal, round, and reactive to light.  Neck: Normal range of motion. Neck supple. Carotid bruit is not present.  Cardiovascular: Normal rate, regular rhythm, normal heart sounds, intact distal pulses and normal pulses.   Pulmonary/Chest:  Breath sounds normal. No respiratory distress.  Abdominal: Soft. Bowel sounds are normal. There is no tenderness. There is no guarding.  Musculoskeletal:       Left ankle: He exhibits decreased range of motion and swelling. He exhibits no deformity. Tenderness. Lateral malleolus tenderness found. Achilles tendon normal.  Lymphadenopathy:       Head (right side): No submandibular adenopathy present.       Head (left side): No submandibular adenopathy present.    He has no cervical adenopathy.  Neurological: He is alert and oriented to person, place, and time. He has normal strength. No cranial nerve deficit or  sensory deficit.  Skin: Skin is warm and dry.  Psychiatric: He has a normal mood and affect. His speech is normal.  Nursing note and vitals reviewed.   ED Course  Procedures (including critical care time) Labs Review Labs Reviewed - No data to display  Imaging Review Dg Ankle Complete Left  12/17/2014   CLINICAL DATA:  Patient jumped out of a truck and has left ankle pain and swelling. Initial encounter.  EXAM: LEFT ANKLE COMPLETE - 3+ VIEW  COMPARISON:  None.  FINDINGS: Soft tissue swelling is seen about the ankle joint. There may be a small joint effusion. No acute osseous abnormality.  IMPRESSION: Soft tissue swelling and probable small joint effusion. No definite fracture.   Electronically Signed   By: Leanna Battles M.D.   On: 12/17/2014 09:52     EKG Interpretation None      MDM  Vital signs are well within normal limits. Review of the x-ray of the left ankle shows no acute fracture or dislocation. There is a questionable small joint effusion present.  I discussed the findings on the examination as well as the findings on the x-ray with the patient in terms which he understands. The plan at this time is for an ankle stirrup splint, crutches, ice, elevation, and orthopedic evaluation for possible ligament injury. The patient acknowledges this discharge plan and is in agreement.    Final diagnoses:  Ankle sprain, left, initial encounter    **I have reviewed nursing notes, vital signs, and all appropriate lab and imaging results for this patient.Ivery Quale, PA-C 12/17/14 1049  Donnetta Hutching, MD 12/19/14 [phone]

## 2015-03-09 ENCOUNTER — Encounter (HOSPITAL_COMMUNITY): Payer: Self-pay | Admitting: Emergency Medicine

## 2015-03-09 ENCOUNTER — Emergency Department (HOSPITAL_COMMUNITY)
Admission: EM | Admit: 2015-03-09 | Discharge: 2015-03-10 | Disposition: A | Discharge status: Home / Self Care | Payer: PRIVATE HEALTH INSURANCE | Attending: Emergency Medicine | Admitting: Emergency Medicine

## 2015-03-09 DIAGNOSIS — Z8669 Personal history of other diseases of the nervous system and sense organs: Secondary | ICD-10-CM | POA: Insufficient documentation

## 2015-03-09 DIAGNOSIS — R0789 Other chest pain: Secondary | ICD-10-CM | POA: Diagnosis not present

## 2015-03-09 DIAGNOSIS — J45901 Unspecified asthma with (acute) exacerbation: Secondary | ICD-10-CM | POA: Diagnosis not present

## 2015-03-09 DIAGNOSIS — R1012 Left upper quadrant pain: Secondary | ICD-10-CM | POA: Diagnosis not present

## 2015-03-09 DIAGNOSIS — R040 Epistaxis: Secondary | ICD-10-CM | POA: Insufficient documentation

## 2015-03-09 DIAGNOSIS — R079 Chest pain, unspecified: Secondary | ICD-10-CM | POA: Diagnosis present

## 2015-03-09 DIAGNOSIS — F419 Anxiety disorder, unspecified: Secondary | ICD-10-CM | POA: Insufficient documentation

## 2015-03-09 NOTE — ED Notes (Signed)
Pt c/o constant generalized chest pain x one month.
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
## 2015-03-10 LAB — CBC WITH DIFFERENTIAL/PLATELET
Basophils Absolute: 0 10*3/uL (ref 0.0–0.1)
Basophils Relative: 0 %
Eosinophils Absolute: 0.2 10*3/uL (ref 0.0–0.7)
Eosinophils Relative: 1 %
HCT: 42.2 % (ref 39.0–52.0)
Hemoglobin: 14.6 g/dL (ref 13.0–17.0)
Lymphocytes Relative: 25 %
Lymphs Abs: 4.1 10*3/uL — ABNORMAL HIGH (ref 0.7–4.0)
MCH: 28.3 pg (ref 26.0–34.0)
MCHC: 34.6 g/dL (ref 30.0–36.0)
MCV: 81.8 fL (ref 78.0–100.0)
Monocytes Absolute: 1.1 10*3/uL — ABNORMAL HIGH (ref 0.1–1.0)
Monocytes Relative: 7 %
Neutro Abs: 11.1 10*3/uL — ABNORMAL HIGH (ref 1.7–7.7)
Neutrophils Relative %: 67 %
Platelets: 290 10*3/uL (ref 150–400)
RBC: 5.16 MIL/uL (ref 4.22–5.81)
RDW: 12.9 % (ref 11.5–15.5)
WBC: 16.6 10*3/uL — ABNORMAL HIGH (ref 4.0–10.5)

## 2015-03-10 LAB — COMPREHENSIVE METABOLIC PANEL
ALT: 41 U/L (ref 17–63)
AST: 26 U/L (ref 15–41)
Albumin: 4.4 g/dL (ref 3.5–5.0)
Alkaline Phosphatase: 72 U/L (ref 38–126)
Anion gap: 6 (ref 5–15)
BUN: 9 mg/dL (ref 6–20)
CO2: 28 mmol/L (ref 22–32)
Calcium: 8.6 mg/dL — ABNORMAL LOW (ref 8.9–10.3)
Chloride: 105 mmol/L (ref 101–111)
Creatinine, Ser: 1.02 mg/dL (ref 0.61–1.24)
GFR calc Af Amer: >60 mL/min (ref >60)
GFR calc non Af Amer: >60 mL/min (ref >60)
Glucose, Bld: 99 mg/dL (ref 65–99)
Potassium: 3.8 mmol/L (ref 3.5–5.1)
Sodium: 139 mmol/L (ref 135–145)
Total Bilirubin: 0.6 mg/dL (ref 0.3–1.2)
Total Protein: 7.3 g/dL (ref 6.5–8.1)

## 2015-03-10 LAB — TROPONIN I: Troponin I: <0.03 ng/mL (ref <0.031)

## 2015-03-10 MED ORDER — NAPROXEN 250 MG PO TABS: 500.0000 mg | ORAL_TABLET | Freq: Once | ORAL | Status: DC

## 2015-03-10 MED ORDER — METHOCARBAMOL 500 MG PO TABS: ORAL_TABLET | ORAL | Status: DC

## 2015-03-10 MED ORDER — CYCLOBENZAPRINE HCL 10 MG PO TABS: 10.0000 mg | ORAL_TABLET | Freq: Once | ORAL | Status: DC

## 2015-03-10 MED ORDER — NAPROXEN 500 MG PO TABS: ORAL_TABLET | ORAL | Status: DC

## 2015-03-10 NOTE — Discharge Instructions (Signed)
Use ice and heat on your chest. Take the medications as prescribed. Follow up with Dr Gerda Diss about your blood pressure, but it has been normal in the ED. Follow up with Behavioral Health or [facility] to discuss your anxiety and to decide what treatment you may need. Return to the ED if you feel you might do something to harm yourself or someone else. You can see Dr Suszanne Conners, and ENT specialist, about your nose bleeds. Put an ice pack on your forehead when you have a nose bleed and pinch your nose like an old fashioned clothes pin to help stop the bleeding.    Emergency Department Resource Guide    [facility]         Address  Phone  Notes  [facility]  [phone]   Angie Fava, PhD [address] [place], [place]   [phone] or [phone]   [facility]   [address] [place], [place] [phone]   [facility] [address] [place], [place] [phone] Insurance/Medicaid/sponsorship through [facility] and Families [address]                                    [place], [place] [phone] Therapy/tele-psych/case  [facility] [address], [place] [phone]    Dr. Lolly Mustache  [phone]   [facility]  [employer] [facility] Dept. 1) [address] [place] 2) [address] [place] 3)  [address], [place] [phone]  [phone]   [facility] Child Abuse Hotline [phone] or [phone] (After Hours)

## 2015-07-03 ENCOUNTER — Ambulatory Visit (INDEPENDENT_AMBULATORY_CARE_PROVIDER_SITE_OTHER): Payer: PRIVATE HEALTH INSURANCE | Admitting: Nurse Practitioner

## 2015-07-03 ENCOUNTER — Encounter: Payer: Self-pay | Admitting: Nurse Practitioner

## 2015-07-03 VITALS — BP 122/90 | Ht 76.0 in | Wt 362.1 lb

## 2015-07-03 DIAGNOSIS — F418 Other specified anxiety disorders: Secondary | ICD-10-CM

## 2015-07-03 MED ORDER — ESCITALOPRAM OXALATE 20 MG PO TABS: 20.0000 mg | ORAL_TABLET | Freq: Every day | ORAL | Status: DC

## 2015-07-06 ENCOUNTER — Encounter: Payer: Self-pay | Admitting: Nurse Practitioner

## 2015-07-06 NOTE — Progress Notes (Signed)
Subjective:  Presents for complaints of a flareup of his depression and anxiety. Has been off medication for the past 2 years. Symptoms began 7-8 months ago when he split up with his girlfriend who is the mother of his baby. States he does fine when his child is with him, shares custody. Trouble sleeping. Social isolation. Agitation. Emotional lability. Denies suicidal or homicidal thoughts or ideation. Has done well on Lexapro in the past.  Objective:   BP 122/90 mmHg  Ht 6\' 4"  (1.93 m)  Wt 362 lb 2 oz (164.259 kg)  BMI 44.10 kg/m2 NAD. Alert, oriented. Calm affect. Thoughts logical coherent and relevant. Dressed appropriately. Lungs clear. Heart regular rate rhythm.  Assessment:  Problem List Items Addressed This Visit      Other   Depression with anxiety - Primary     Plan:  Meds ordered this encounter  Medications  . escitalopram (LEXAPRO) 20 MG tablet    Sig: Take 1 tablet (20 mg total) by mouth daily.    Dispense:  30 tablet    Refill:  2    Order Specific Question:  Supervising Provider    Answer:  Merlyn AlbertLUKING, WILLIAM S [[id number]]   Restart Lexapro as directed. Start with half tab by mouth daily for several days then if needed, increase to one tab. DC med and call if any adverse effects. Call back in 4-6 weeks if no improvement. Return in about 3 months (around 10/01/2015) for recheck.

## 2015-09-03 ENCOUNTER — Encounter: Payer: Self-pay | Admitting: Family Medicine

## 2015-10-02 ENCOUNTER — Ambulatory Visit: Payer: PRIVATE HEALTH INSURANCE | Admitting: Nurse Practitioner

## 2015-12-29 ENCOUNTER — Encounter (HOSPITAL_COMMUNITY): Payer: Self-pay | Admitting: *Deleted

## 2015-12-29 ENCOUNTER — Emergency Department (HOSPITAL_COMMUNITY)
Admission: EM | Admit: 2015-12-29 | Discharge: 2015-12-29 | Disposition: A | Discharge status: Home / Self Care | Payer: PRIVATE HEALTH INSURANCE | Attending: Emergency Medicine | Admitting: Emergency Medicine

## 2015-12-29 DIAGNOSIS — J45909 Unspecified asthma, uncomplicated: Secondary | ICD-10-CM | POA: Diagnosis not present

## 2015-12-29 DIAGNOSIS — F17210 Nicotine dependence, cigarettes, uncomplicated: Secondary | ICD-10-CM | POA: Diagnosis not present

## 2015-12-29 DIAGNOSIS — Z79899 Other long term (current) drug therapy: Secondary | ICD-10-CM | POA: Diagnosis not present

## 2015-12-29 DIAGNOSIS — H6093 Unspecified otitis externa, bilateral: Secondary | ICD-10-CM | POA: Diagnosis not present

## 2015-12-29 DIAGNOSIS — H6123 Impacted cerumen, bilateral: Secondary | ICD-10-CM | POA: Insufficient documentation

## 2015-12-29 DIAGNOSIS — I10 Essential (primary) hypertension: Secondary | ICD-10-CM | POA: Diagnosis not present

## 2015-12-29 DIAGNOSIS — H938X3 Other specified disorders of ear, bilateral: Secondary | ICD-10-CM | POA: Diagnosis present

## 2015-12-29 HISTORY — DX: Essential (primary) hypertension: I10

## 2015-12-29 MED ORDER — NEOMYCIN-POLYMYXIN-HC 3.5-10000-1 OT SUSP: 4.0000 [drp] | Freq: Three times a day (TID) | OTIC | Status: DC

## 2015-12-29 NOTE — ED Provider Notes (Signed)
CSN: [id number]     Arrival date & time 12/29/15  1406 History   First MD Initiated Contact with Patient 12/29/15 1510     Chief Complaint  Patient presents with  . Ear Fullness     (Consider location/radiation/quality/duration/timing/severity/associated sxs/prior Treatment) Patient is a 25 y.o. male presenting with plugged ear sensation. The history is provided by the patient. No language interpreter was used.  Ear Fullness This is a new problem. The current episode started in the past 7 days. The problem occurs constantly. The problem has been gradually worsening. Pertinent negatives include no fever. Nothing aggravates the symptoms. He has tried nothing for the symptoms. The treatment provided moderate relief.  Pt thinks he has swimmers ear  Past Medical History  Diagnosis Date  . Asthma   . Morbid obesity (HCC)   . Migraine with aura   . Hyperinsulinemia   . Hypertension    Past Surgical History  Procedure Laterality Date  . Tonsillectomy     History reviewed. No pertinent family history. Social History  Substance Use Topics  . Smoking status: Never Smoker   . Smokeless tobacco: Never Used  . Alcohol Use: 0.0 oz/week    0 Standard drinks or equivalent per week     Comment: occasional    Review of Systems  Constitutional: Negative for fever.  All other systems reviewed and are negative.     Allergies  Adipex-p  Home Medications   Prior to Admission medications   Medication Sig Start Date End Date Taking? Authorizing Provider  escitalopram (LEXAPRO) 20 MG tablet Take 1 tablet (20 mg total) by mouth daily. 07/03/15 07/02/16  Campbell Richesarolyn C Hoskins, NP   BP 149/82 mmHg  Pulse 100  Temp(Src) 98.1 F (36.7 C) (Oral)  Resp 18  Ht 6' (1.829 m)  Wt 145.151 kg  BMI 43.39 kg/m2  SpO2 97% Physical Exam  Constitutional: He is oriented to person, place, and time. He appears well-developed and well-nourished.  HENT:  Head: Normocephalic.  Wax bilat ears,  irrigatted   Erythema to canals  Eyes: EOM are normal.  Neck: Normal range of motion.  Pulmonary/Chest: Effort normal.  Abdominal: He exhibits no distension.  Musculoskeletal: Normal range of motion.  Neurological: He is alert and oriented to person, place, and time.  Psychiatric: He has a normal mood and affect.  Nursing note and vitals reviewed.   ED Course  Procedures (including critical care time) Labs Review Labs Reviewed - No data to display  Imaging Review No results found. I have personally reviewed and evaluated these images and lab results as part of my medical decision-making.   EKG Interpretation None      MDM TM's irrigatted with water and peroxide    Final diagnoses:  Cerumen impaction, bilateral  Otitis external, bilateral    Cortisporin otic    Elson AreasLeslie K Sofia, PA-C 12/29/15 1557  Lavera Guiseana Duo Liu, MD 12/30/15 1521

## 2015-12-29 NOTE — ED Notes (Signed)
Pt with ear fullness since swimming on Friday and began to have pain to bilateral ears on Sunday.  C/o dizziness as well

## 2015-12-29 NOTE — Discharge Instructions (Signed)
Otitis Externa Otitis externa is a bacterial or fungal infection of the outer ear canal. This is the area from the eardrum to the outside of the ear. Otitis externa is sometimes called "swimmer's ear." CAUSES  Possible causes of infection include:  Swimming in dirty water.  Moisture remaining in the ear after swimming or bathing.  Mild injury (trauma) to the ear.  Objects stuck in the ear (foreign body).  Cuts or scrapes (abrasions) on the outside of the ear. SIGNS AND SYMPTOMS  The first symptom of infection is often itching in the ear canal. Later signs and symptoms may include swelling and redness of the ear canal, ear pain, and yellowish-white fluid (pus) coming from the ear. The ear pain may be worse when pulling on the earlobe. DIAGNOSIS  Your health care provider will perform a physical exam. A sample of fluid may be taken from the ear and examined for bacteria or fungi. TREATMENT  Antibiotic ear drops are often given for 10 to 14 days. Treatment may also include pain medicine or corticosteroids to reduce itching and swelling. HOME CARE INSTRUCTIONS   Apply antibiotic ear drops to the ear canal as prescribed by your health care provider.  Take medicines only as directed by your health care provider.  If you have diabetes, follow any additional treatment instructions from your health care provider.  Keep all follow-up visits as directed by your health care provider. PREVENTION   Keep your ear dry. Use the corner of a towel to absorb water out of the ear canal after swimming or bathing.  Avoid scratching or putting objects inside your ear. This can damage the ear canal or remove the protective wax that lines the canal. This makes it easier for bacteria and fungi to grow.  Avoid swimming in lakes, polluted water, or poorly chlorinated pools.  You may use ear drops made of rubbing alcohol and vinegar after swimming. Combine equal parts of white vinegar and alcohol in a bottle.  Put 3 or 4 drops into each ear after swimming. SEEK MEDICAL CARE IF:   You have a fever.  Your ear is still red, swollen, painful, or draining pus after 3 days.  Your redness, swelling, or pain gets worse.  You have a severe headache.  You have redness, swelling, pain, or tenderness in the area behind your ear. MAKE SURE YOU:   Understand these instructions.  Will watch your condition.  Will get help right away if you are not doing well or get worse.   This information is not intended to replace advice given to you by your health care provider. Make sure you discuss any questions you have with your health care provider.   Document Released: 06/06/2005 Document Revised: 06/27/2014 Document Reviewed: 06/23/2011 Elsevier Interactive Patient Education 2016 Elsevier Inc. Cerumen Impaction The structures of the external ear canal secrete a waxy substance known as cerumen. Excess cerumen can build up in the ear canal, causing a condition known as cerumen impaction. Cerumen impaction can cause ear pain and disrupt the function of the ear. The rate of cerumen production differs for each individual. In certain individuals, the configuration of the ear canal may decrease his or her ability to naturally remove cerumen. CAUSES Cerumen impaction is caused by excessive cerumen production or buildup. RISK FACTORS  Frequent use of swabs to clean ears.  Having narrow ear canals.  Having eczema.  Being dehydrated. SIGNS AND SYMPTOMS  Diminished hearing.  Ear drainage.  Ear pain.  Ear itch. TREATMENT  Treatment may involve:  Over-the-counter or prescription ear drops to soften the cerumen.  Removal of cerumen by a health care provider. This may be done with:  Irrigation with warm water. This is the most common method of removal.  Ear curettes and other instruments.  Surgery. This may be done in severe cases. HOME CARE INSTRUCTIONS  Take medicines only as directed by your  health care provider.  Do not insert objects into the ear with the intent of cleaning the ear. PREVENTION  Do not insert objects into the ear, even with the intent of cleaning the ear. Removing cerumen as a part of normal hygiene is not necessary, and the use of swabs in the ear canal is not recommended.  Drink enough water to keep your urine clear or pale yellow.  Control your eczema if you have it. SEEK MEDICAL CARE IF:  You develop ear pain.  You develop bleeding from the ear.  The cerumen does not clear after you use ear drops as directed.   This information is not intended to replace advice given to you by your health care provider. Make sure you discuss any questions you have with your health care provider.   Document Released: 07/14/2004 Document Revised: 06/27/2014 Document Reviewed: 01/21/2015 Elsevier Interactive Patient Education [employer] Elsevier Inc.

## 2016-01-04 ENCOUNTER — Telehealth: Payer: Self-pay | Admitting: Family Medicine

## 2016-01-04 ENCOUNTER — Emergency Department (HOSPITAL_COMMUNITY)
Admission: EM | Admit: 2016-01-04 | Discharge: 2016-01-05 | Disposition: A | Discharge status: Home / Self Care | Payer: PRIVATE HEALTH INSURANCE | Attending: Emergency Medicine | Admitting: Emergency Medicine

## 2016-01-04 ENCOUNTER — Encounter (HOSPITAL_COMMUNITY): Payer: Self-pay | Admitting: *Deleted

## 2016-01-04 ENCOUNTER — Emergency Department (HOSPITAL_COMMUNITY): Payer: PRIVATE HEALTH INSURANCE

## 2016-01-04 DIAGNOSIS — M7989 Other specified soft tissue disorders: Secondary | ICD-10-CM | POA: Diagnosis present

## 2016-01-04 DIAGNOSIS — J45909 Unspecified asthma, uncomplicated: Secondary | ICD-10-CM | POA: Insufficient documentation

## 2016-01-04 DIAGNOSIS — M109 Gout, unspecified: Secondary | ICD-10-CM | POA: Diagnosis not present

## 2016-01-04 DIAGNOSIS — I10 Essential (primary) hypertension: Secondary | ICD-10-CM | POA: Diagnosis not present

## 2016-01-04 DIAGNOSIS — Z6841 Body Mass Index (BMI) 40.0 and over, adult: Secondary | ICD-10-CM | POA: Insufficient documentation

## 2016-01-04 DIAGNOSIS — R51 Headache: Secondary | ICD-10-CM | POA: Diagnosis not present

## 2016-01-04 LAB — CBC WITH DIFFERENTIAL/PLATELET
Basophils Absolute: 0 10*3/uL (ref 0.0–0.1)
Basophils Relative: 0 %
Eosinophils Absolute: 0.2 10*3/uL (ref 0.0–0.7)
Eosinophils Relative: 1 %
HCT: 45.4 % (ref 39.0–52.0)
Hemoglobin: 15.9 g/dL (ref 13.0–17.0)
Lymphocytes Relative: 19 %
Lymphs Abs: 3.2 10*3/uL (ref 0.7–4.0)
MCH: 28.8 pg (ref 26.0–34.0)
MCHC: 35 g/dL (ref 30.0–36.0)
MCV: 82.2 fL (ref 78.0–100.0)
Monocytes Absolute: 0.9 10*3/uL (ref 0.1–1.0)
Monocytes Relative: 5 %
Neutro Abs: 12.7 10*3/uL — ABNORMAL HIGH (ref 1.7–7.7)
Neutrophils Relative %: 75 %
Platelets: 287 10*3/uL (ref 150–400)
RBC: 5.52 MIL/uL (ref 4.22–5.81)
RDW: 12.9 % (ref 11.5–15.5)
WBC: 17 10*3/uL — ABNORMAL HIGH (ref 4.0–10.5)

## 2016-01-04 MED ORDER — OXYCODONE-ACETAMINOPHEN 5-325 MG PO TABS
1.0000 | ORAL_TABLET | Freq: Once | ORAL | Status: AC
  Administered 2016-01-04: 1 via ORAL
  Filled 2016-01-04: qty 1

## 2016-01-04 MED ORDER — ONDANSETRON 8 MG PO TBDP
8.0000 mg | ORAL_TABLET | Freq: Once | ORAL | Status: AC
  Administered 2016-01-04: 8 mg via ORAL
  Filled 2016-01-04: qty 1

## 2016-01-04 NOTE — ED Provider Notes (Signed)
CSN: [id number]     Arrival date & time 01/04/16  1841 History   First MD Initiated Contact with Patient 01/04/16 2307     Chief Complaint  Patient presents with  . Leg Swelling     (Consider location/radiation/quality/duration/timing/severity/associated sxs/prior Treatment) HPI   Jason [name] is a 25 y.o. male who presents to the Emergency Department complaining of gradual onset of right great toe pain and swelling.  He describes a throbbing pain that is constant.  He denies injury, hx of gout, ingrown toenail.  Pain is localized to the great toe only.  He also reports hx of HTN and blood pressure has been elevated for several days.  He states that he stopped taking his BP meds "a really long time ago" and states that his primary doctor just stopped prescribing it.  He reports associated headache tonight that is frontal, gradual onset.  He denies shortness of breath, chest pain, vomiting, fever and dizziness.     Past Medical History  Diagnosis Date  . Asthma   . Morbid obesity (HCC)   . Migraine with aura   . Hyperinsulinemia   . Hypertension    Past Surgical History  Procedure Laterality Date  . Tonsillectomy     History reviewed. No pertinent family history. Social History  Substance Use Topics  . Smoking status: Never Smoker   . Smokeless tobacco: Never Used  . Alcohol Use: 0.0 oz/week    0 Standard drinks or equivalent per week     Comment: occasional    Review of Systems  Constitutional: Negative for fever, chills and appetite change.  Eyes: Negative for visual disturbance.  Respiratory: Negative for shortness of breath.   Cardiovascular: Negative for chest pain.  Gastrointestinal: Negative for nausea, vomiting and abdominal pain.  Genitourinary: Negative for dysuria and difficulty urinating.  Musculoskeletal: Positive for joint swelling and arthralgias (right great toe pain).  Skin: Negative for color change and rash.  Neurological: Positive for headaches.  Negative for dizziness, weakness and numbness.      Allergies  Codeine and Adipex-p  Home Medications   Prior to Admission medications   Not on File   BP 144/104 mmHg  Pulse 99  Temp(Src) 98.3 F (36.8 C) (Oral)  Resp 14  Ht 6' (1.829 m)  Wt 146.965 kg  BMI 43.93 kg/m2  SpO2 99% Physical Exam  Constitutional: He is oriented to person, place, and time. He appears well-developed and well-nourished. No distress.  HENT:  Head: Normocephalic.  Eyes: Conjunctivae and EOM are normal. Pupils are equal, round, and reactive to light.  Neck: Normal range of motion and full passive range of motion without pain. Neck supple. No Kernig's sign noted.  Cardiovascular: Normal rate, regular rhythm and intact distal pulses.   No murmur heard. Pulmonary/Chest: Effort normal and breath sounds normal. No respiratory distress. He exhibits no tenderness.  Abdominal: Soft. He exhibits no distension. There is no tenderness.  Musculoskeletal: Normal range of motion. He exhibits tenderness.  ttp of the entire right great toe.  Mild edema noted.  Sensation intact.    Neurological: He is alert and oriented to person, place, and time. Coordination normal.  Psychiatric: He has a normal mood and affect.  Nursing note and vitals reviewed.   ED Course  Procedures (including critical care time) Labs Review Labs Reviewed  BASIC METABOLIC PANEL - Abnormal; Notable for the following:    Glucose, Bld 120 (*)    All other components within normal limits  CBC  WITH DIFFERENTIAL/PLATELET - Abnormal; Notable for the following:    WBC 17.0 (*)    Neutro Abs 12.7 (*)    All other components within normal limits  URIC ACID - Abnormal; Notable for the following:    Uric Acid, Serum 9.4 (*)    All other components within normal limits    Imaging Review No results found. I have personally reviewed and evaluated these images and lab results as part of my medical decision-making.   EKG Interpretation None       MDM   Final diagnoses:  Acute gout of right foot, unspecified cause  Essential hypertension     Pt non-toxic appearing.  Right great toe pain in obese male likely gout.  No obvious cellulitis or open wounds of the foot.  NV intact.  Hx of HTN w/o medications for some time.  I will start HCTZ and pt agrees to arrange PMD f/u.  Stable for d/c.     Pauline Ausammy Keimon Basaldua, PA-C 01/05/16 0109  Azalia BilisKevin Campos, MD 01/05/16 [phone]

## 2016-01-04 NOTE — Telephone Encounter (Signed)
Patient called and stated that blood pressure was elevated at 122/112 with edema to bilateral knees, hands, ankle, and feet. Patient states onset of symptoms last week. Patient wanted appointment for this Friday due to working out of town. Discussed with Dr.Scott Luking in real time and told to advise patient to go to urgent care or ER where he is at and follow up with us after the appointment. Patient verbalized understanding.

## 2016-01-04 NOTE — ED Notes (Signed)
Pt back from x-ray.

## 2016-01-04 NOTE — ED Notes (Signed)
Pt c/o swelling to right foot and states his BP has been elevated for the last few days

## 2016-01-05 LAB — BASIC METABOLIC PANEL
Anion gap: 9 (ref 5–15)
BUN: 13 mg/dL (ref 6–20)
CO2: 27 mmol/L (ref 22–32)
Calcium: 8.9 mg/dL (ref 8.9–10.3)
Chloride: 102 mmol/L (ref 101–111)
Creatinine, Ser: 1.14 mg/dL (ref 0.61–1.24)
GFR calc Af Amer: >60 mL/min (ref >60)
GFR calc non Af Amer: >60 mL/min (ref >60)
Glucose, Bld: 120 mg/dL — ABNORMAL HIGH (ref 65–99)
Potassium: 3.8 mmol/L (ref 3.5–5.1)
Sodium: 138 mmol/L (ref 135–145)

## 2016-01-05 LAB — URIC ACID: Uric Acid, Serum: 9.4 mg/dL — ABNORMAL HIGH (ref 4.4–7.6)

## 2016-01-05 MED ORDER — OXYCODONE-ACETAMINOPHEN 5-325 MG PO TABS
1.0000 | ORAL_TABLET | Freq: Once | ORAL | Status: AC
  Administered 2016-01-05: 1 via ORAL
  Filled 2016-01-05: qty 1

## 2016-01-05 MED ORDER — PREDNISONE 50 MG PO TABS
60.0000 mg | ORAL_TABLET | Freq: Once | ORAL | Status: AC
  Administered 2016-01-05: 60 mg via ORAL
  Filled 2016-01-05: qty 1

## 2016-01-05 MED ORDER — HYDROCHLOROTHIAZIDE 25 MG PO TABS: 25.0000 mg | ORAL_TABLET | Freq: Every day | ORAL | Status: DC

## 2016-01-05 MED ORDER — PREDNISONE 10 MG PO TABS: ORAL_TABLET | ORAL | Status: DC

## 2016-01-05 MED ORDER — HYDROCODONE-ACETAMINOPHEN 5-325 MG PO TABS: ORAL_TABLET | ORAL | Status: DC

## 2016-01-05 NOTE — Discharge Instructions (Signed)
Gout  Gout is when your joints become red, sore, and swell (inflamed). This is caused by the buildup of uric acid crystals in the joints. Uric acid is a chemical that is normally in the blood. If the level of uric acid gets too high in the blood, these crystals form in your joints and tissues. Over time, these crystals can form into masses near the joints and tissues. These masses can destroy bone and cause the bone to look misshapen (deformed).  HOME CARE    Do not take aspirin for pain.   Only take medicine as told by your doctor.   Rest the joint as much as you can. When in bed, keep sheets and blankets off painful areas.   Keep the sore joints raised (elevated).   Put warm or cold packs on painful joints. Use of warm or cold packs depends on which works best for you.   Use crutches if the painful joint is in your leg.   Drink enough fluids to keep your pee (urine) clear or pale yellow. Limit alcohol, sugary drinks, and drinks with fructose in them.   Follow your diet instructions. Pay careful attention to how much protein you eat. Include fruits, vegetables, whole grains, and fat-free or low-fat milk products in your daily diet. Talk to your doctor or dietitian about the use of coffee, vitamin C, and cherries. These may help lower uric acid levels.   Keep a healthy body weight.  GET HELP RIGHT AWAY IF:    You have watery poop (diarrhea), throw up (vomit), or have any side effects from medicines.   You do not feel better in 24 hours, or you are getting worse.   Your joint becomes suddenly more tender, and you have chills or a fever.  MAKE SURE YOU:    Understand these instructions.   Will watch your condition.   Will get help right away if you are not doing well or get worse.     This information is not intended to replace advice given to you by your health care provider. Make sure you discuss any questions you have with your health care provider.     Document Released: 03/15/2008 Document Revised:  06/27/2014 Document Reviewed: 01/18/2012  Elsevier Interactive Patient Education 2016 Elsevier Inc[employer]  Low-Purine Diet  Purines are compounds that affect the level of uric acid in your body. A low-purine diet is a diet that is low in purines. Eating a low-purine diet can prevent the level of uric acid in your body from getting too high and causing gout or kidney stones or both.  WHAT DO I NEED TO KNOW ABOUT THIS DIET?   Choose low-purine foods. Examples of low-purine foods are listed in the next section.   Drink plenty of fluids, especially water. Fluids can help remove uric acid from your body. Try to drink 8-16 cups (1.9-3.8 L) a day.   Limit foods high in fat, especially saturated fat, as fat makes it harder for the body to get rid of uric acid. Foods high in saturated fat include pizza, cheese, ice cream, whole milk, fried foods, and gravies. Choose foods that are lower in fat and lean sources of protein. Use olive oil when cooking as it contains healthy fats that are not high in saturated fat.   Limit alcohol. Alcohol interferes with the elimination of uric acid from your body. If you are having a gout attack, avoid all alcohol.   Keep in mind that different people's bodies   react differently to different foods. You will probably learn over time which foods do or do not affect you. If you discover that a food tends to cause your gout to flare up, avoid eating that food. You can more freely enjoy foods that do not cause problems. If you have any questions about a food item, talk to your dietitian or health care provider.  WHICH FOODS ARE LOW, MODERATE, AND HIGH IN PURINES?  The following is a list of foods that are low, moderate, and high in purines. You can eat any amount of the foods that are low in purines. You may be able to have small amounts of foods that are moderate in purines. Ask your health care provider how much of a food moderate in purines you can have. Avoid foods high in  purines.  Grains   Foods low in purines: Enriched white bread, pasta, rice, cake, cornbread, popcorn.   Foods moderate in purines: Whole-grain breads and cereals, wheat germ, bran, oatmeal. Uncooked oatmeal. Dry wheat bran or wheat germ.   Foods high in purines: Pancakes, French toast, biscuits, muffins.  Vegetables   Foods low in purines: All vegetables, except those that are moderate in purines.   Foods moderate in purines: Asparagus, cauliflower, spinach, mushrooms, green peas.  Fruits   All fruits are low in purines.  Meats and other Protein Foods   Foods low in purines: Eggs, nuts, peanut butter.   Foods moderate in purines: 80-90% lean beef, lamb, veal, pork, poultry, fish, eggs, peanut butter, nuts. Crab, lobster, oysters, and shrimp. Cooked dried beans, peas, and lentils.   Foods high in purines: Anchovies, sardines, herring, mussels, tuna, codfish, scallops, trout, and haddock. Bacon. Organ meats (such as liver or kidney). Tripe. Game meat. Goose. Sweetbreads.  Dairy   All dairy foods are low in purines. Low-fat and fat-free dairy products are best because they are low in saturated fat.  Beverages   Drinks low in purines: Water, carbonated beverages, tea, coffee, cocoa.   Drinks moderate in purines: Soft drinks and other drinks sweetened with high-fructose corn syrup. Juices. To find whether a food or drink is sweetened with high-fructose corn syrup, look at the ingredients list.   Drinks high in purines: Alcoholic beverages (such as beer).  Condiments   Foods low in purines: Salt, herbs, olives, pickles, relishes, vinegar.   Foods moderate in purines: Butter, margarine, oils, mayonnaise.  Fats and Oils   Foods low in purines: All types, except gravies and sauces made with meat.   Foods high in purines: Gravies and sauces made with meat.  Other Foods   Foods low in purines: Sugars, sweets, gelatin. Cake. Soups made without meat.   Foods moderate in purines: Meat-based or fish-based soups,  broths, or bouillons. Foods and drinks sweetened with high-fructose corn syrup.   Foods high in purines: High-fat desserts (such as ice cream, cookies, cakes, pies, doughnuts, and chocolate).  Contact your dietitian for more information on foods that are not listed here.     This information is not intended to replace advice given to you by your health care provider. Make sure you discuss any questions you have with your health care provider.     Document Released: 10/01/2010 Document Revised: 06/11/2013 Document Reviewed: 05/13/2013  Elsevier Interactive Patient Education 2016 Elsevier Inc.

## 2016-01-13 IMAGING — CR DG KNEE COMPLETE 4+V*R*
4 series · 4 of 4 positions shown · non-contrast
Comparison: None.

CLINICAL DATA: Right knee pain.

EXAM:
RIGHT KNEE - COMPLETE 4+ VIEW

[view not recorded (1 of 4)]
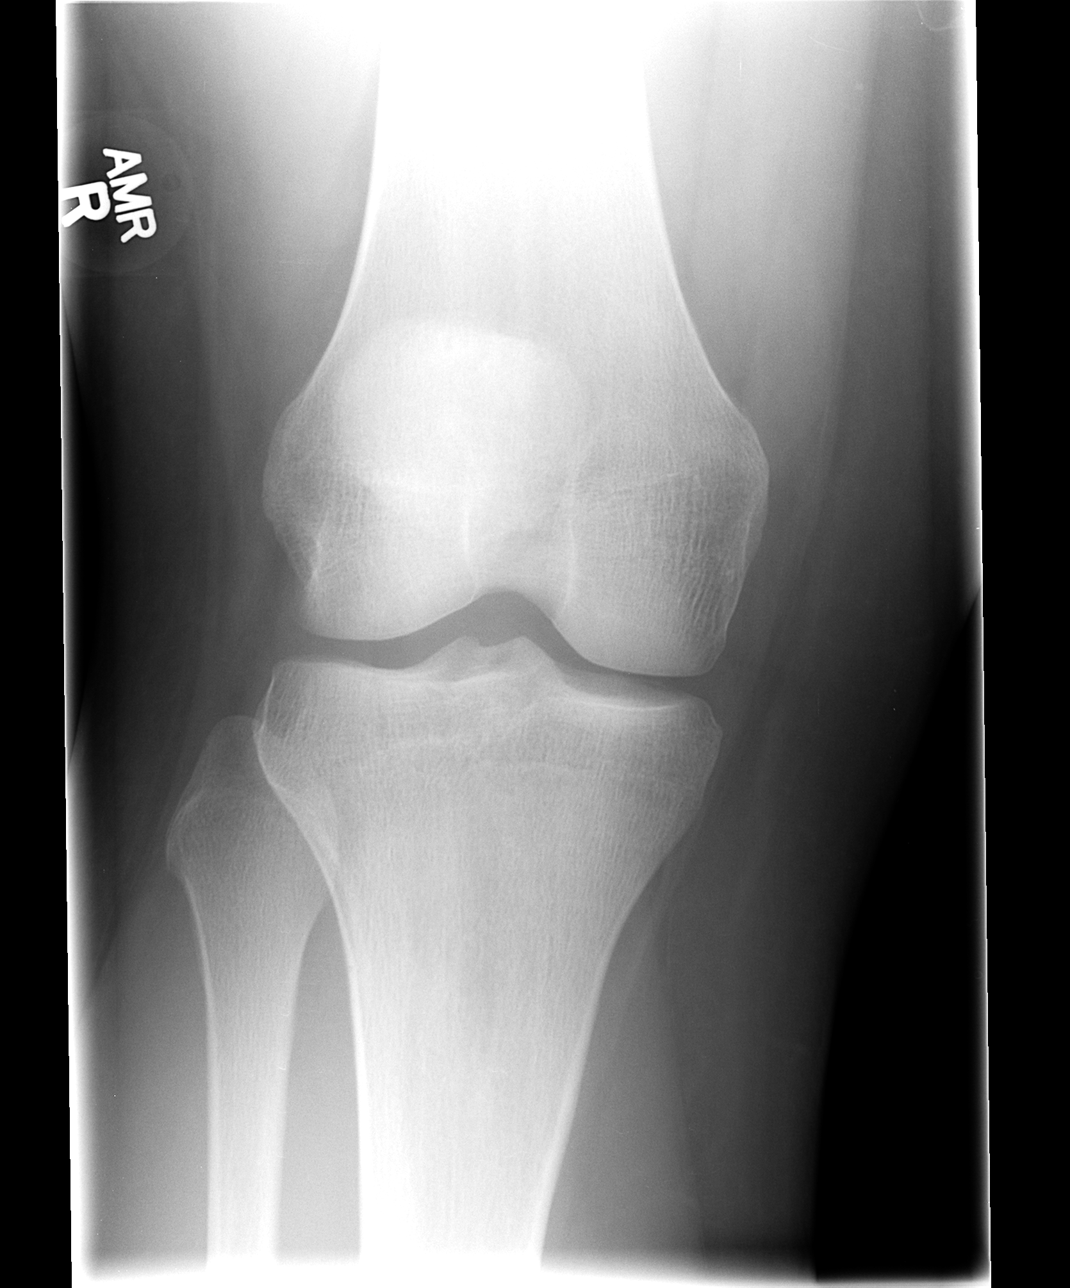

[view not recorded (2 of 4)]
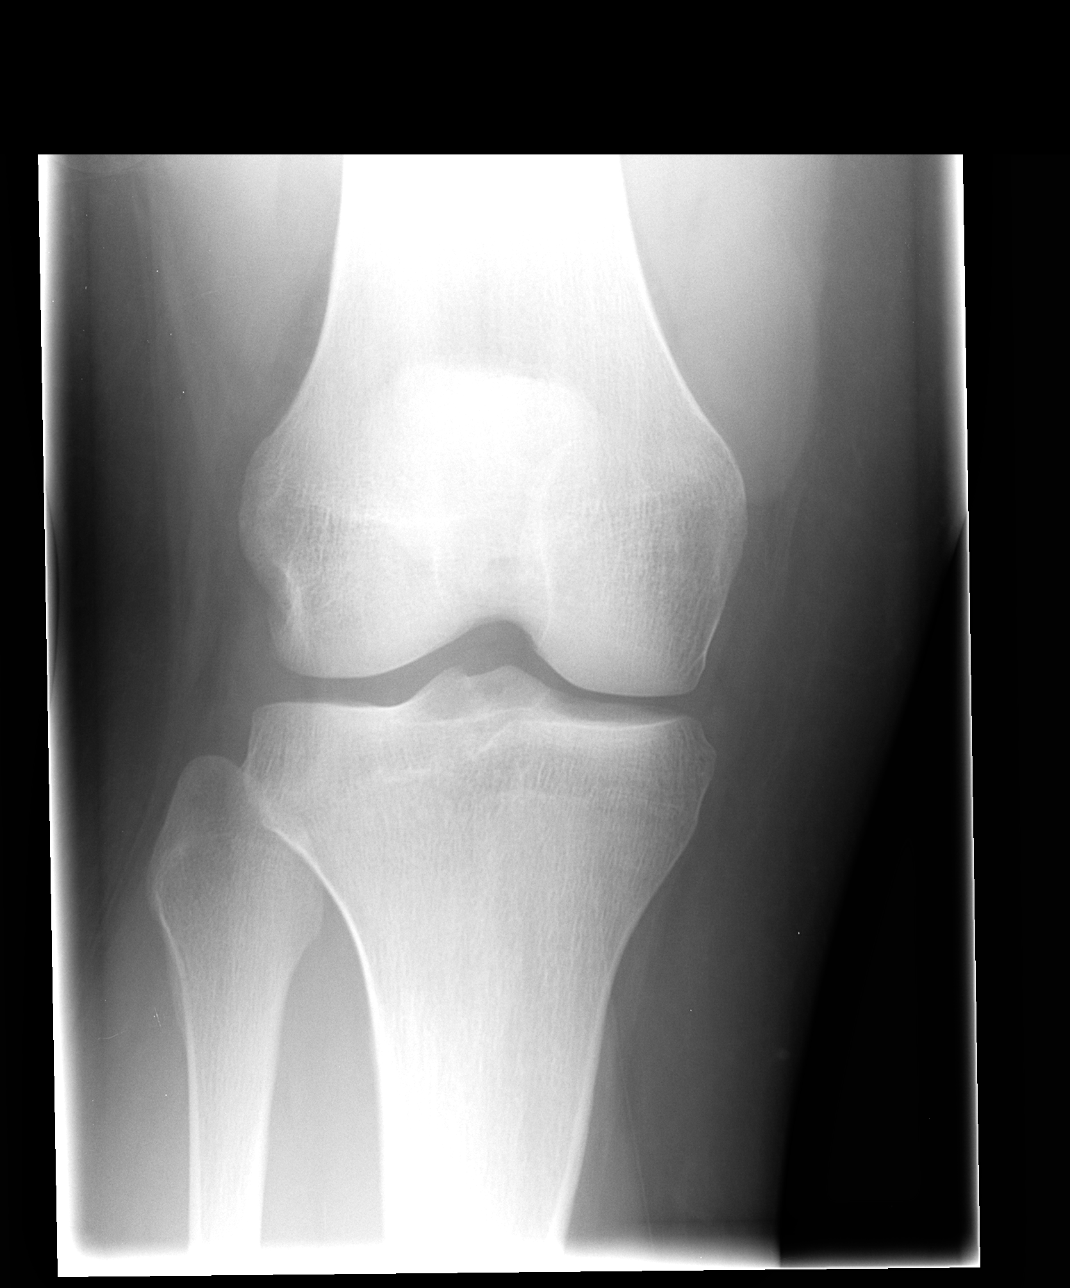

[view not recorded (3 of 4)]
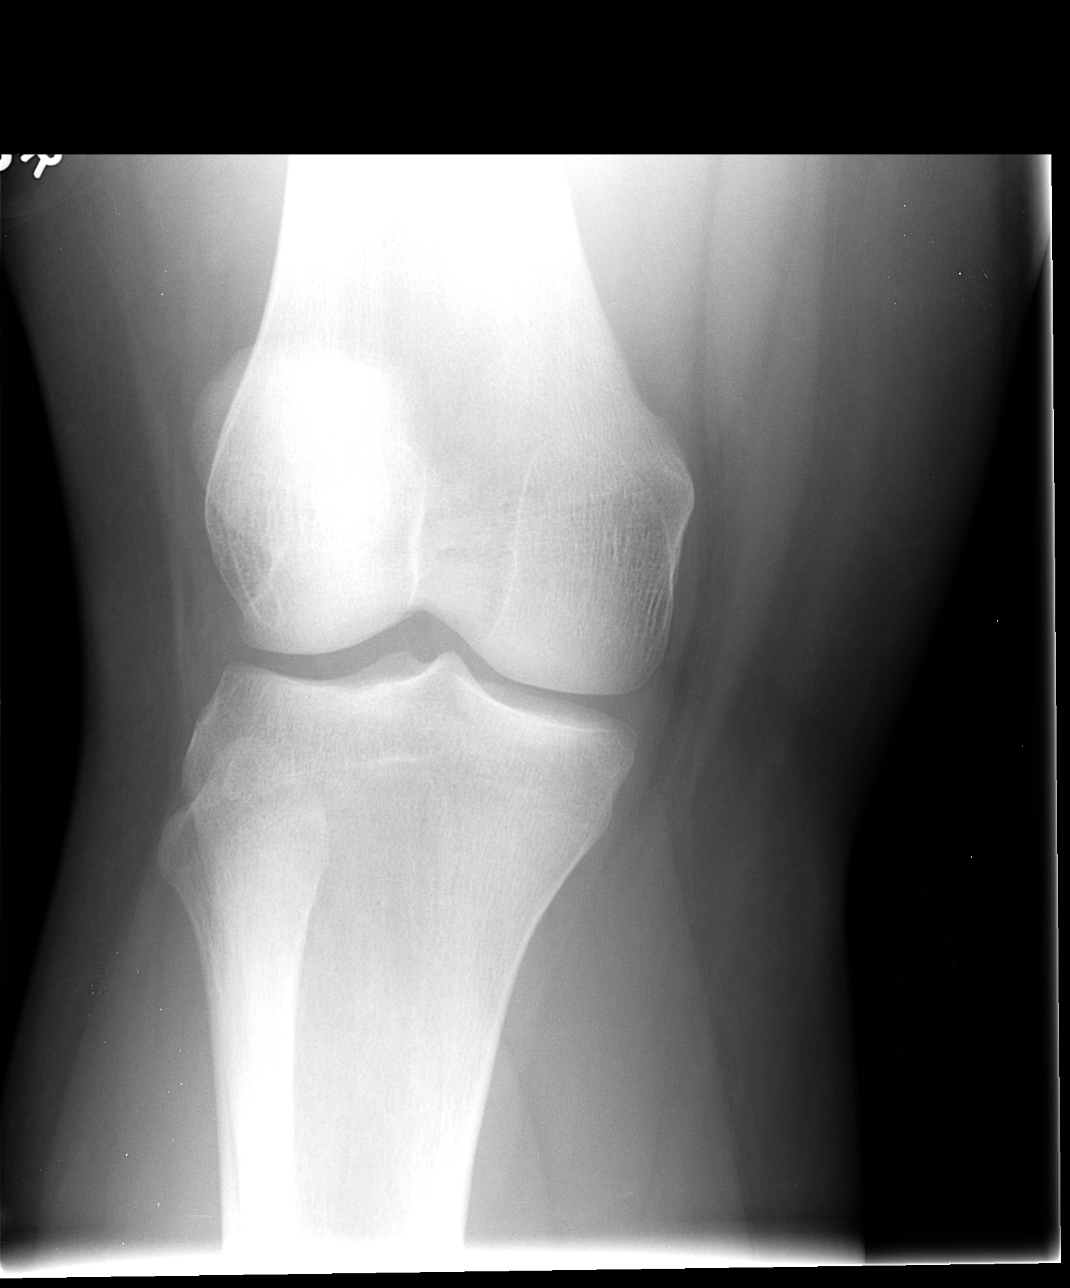

[view not recorded (4 of 4)]
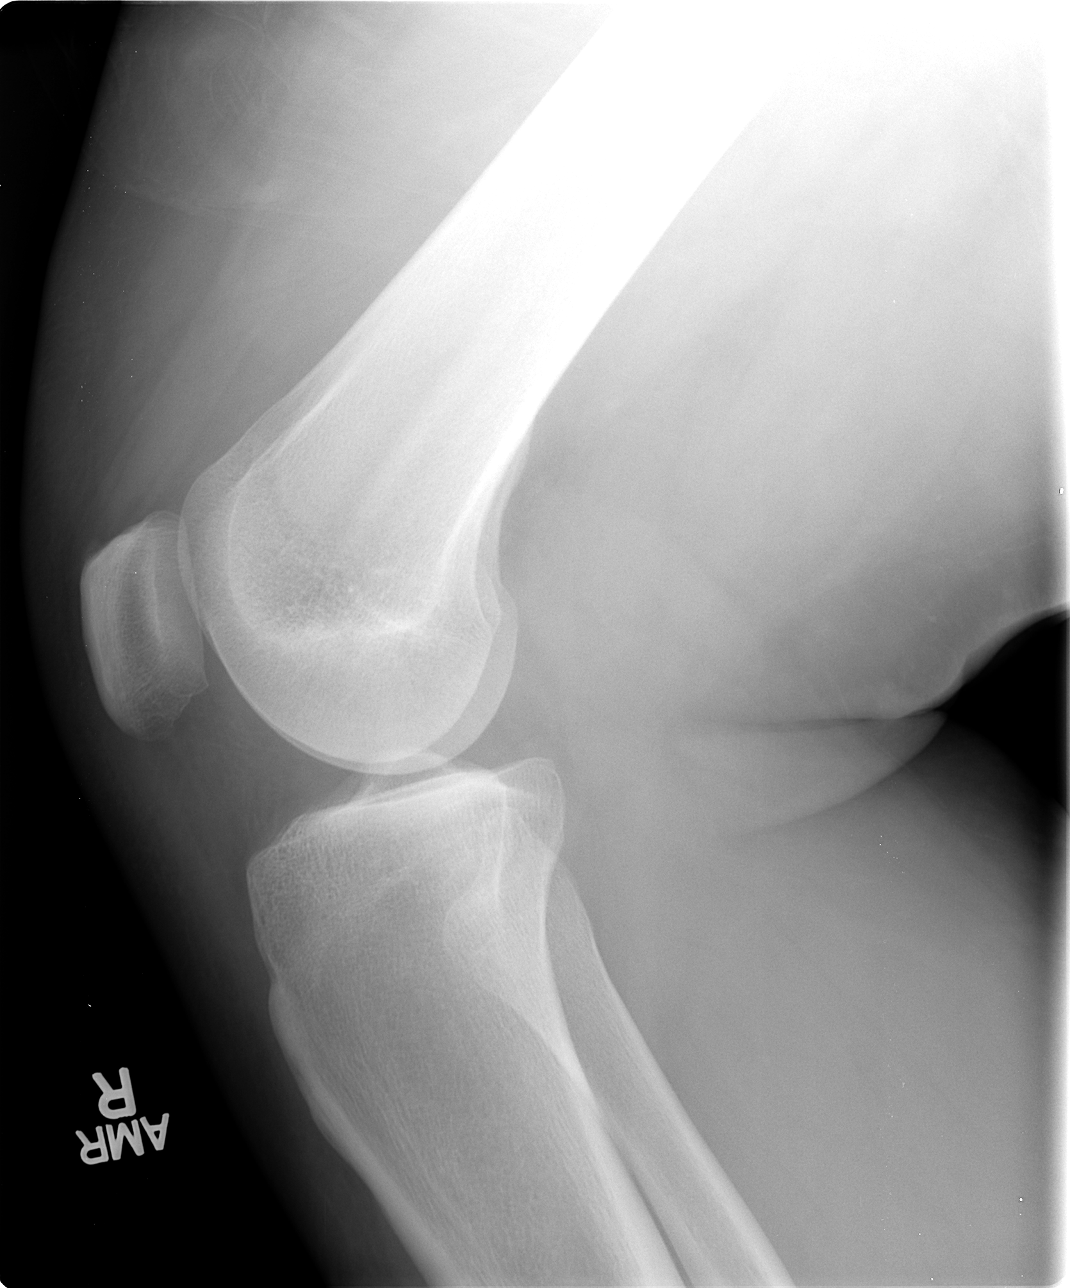

[4 of 4 positions shown; findings below may reference images not displayed]

FINDINGS: There is no evidence of fracture, dislocation, or joint effusion.
There is no evidence of arthropathy or other focal bone abnormality.
Soft tissues are unremarkable.
IMPRESSION: Normal right knee.

## 2016-05-25 ENCOUNTER — Encounter (HOSPITAL_COMMUNITY): Payer: Self-pay | Admitting: *Deleted

## 2016-05-25 ENCOUNTER — Emergency Department (HOSPITAL_COMMUNITY)
Admission: EM | Admit: 2016-05-25 | Discharge: 2016-05-25 | Disposition: A | Discharge status: Home / Self Care | Payer: PRIVATE HEALTH INSURANCE | Attending: Emergency Medicine | Admitting: Emergency Medicine

## 2016-05-25 DIAGNOSIS — X58XXXA Exposure to other specified factors, initial encounter: Secondary | ICD-10-CM | POA: Insufficient documentation

## 2016-05-25 DIAGNOSIS — M545 Low back pain: Secondary | ICD-10-CM | POA: Insufficient documentation

## 2016-05-25 DIAGNOSIS — J45909 Unspecified asthma, uncomplicated: Secondary | ICD-10-CM | POA: Insufficient documentation

## 2016-05-25 DIAGNOSIS — I10 Essential (primary) hypertension: Secondary | ICD-10-CM | POA: Insufficient documentation

## 2016-05-25 DIAGNOSIS — Y93F2 Activity, caregiving, lifting: Secondary | ICD-10-CM | POA: Insufficient documentation

## 2016-05-25 DIAGNOSIS — Y929 Unspecified place or not applicable: Secondary | ICD-10-CM | POA: Insufficient documentation

## 2016-05-25 DIAGNOSIS — M549 Dorsalgia, unspecified: Secondary | ICD-10-CM

## 2016-05-25 DIAGNOSIS — Y990 Civilian activity done for income or pay: Secondary | ICD-10-CM | POA: Insufficient documentation

## 2016-05-25 MED ORDER — CYCLOBENZAPRINE HCL 10 MG PO TABS: 10.0000 mg | ORAL_TABLET | Freq: Three times a day (TID) | ORAL | 0 refills | Status: DC | PRN

## 2016-05-25 MED ORDER — DIAZEPAM 5 MG PO TABS
10.0000 mg | ORAL_TABLET | Freq: Once | ORAL | Status: AC
  Administered 2016-05-25: 10 mg via ORAL
  Filled 2016-05-25: qty 2

## 2016-05-25 MED ORDER — KETOROLAC TROMETHAMINE 60 MG/2ML IM SOLN
60.0000 mg | Freq: Once | INTRAMUSCULAR | Status: AC
  Administered 2016-05-25: 60 mg via INTRAMUSCULAR
  Filled 2016-05-25: qty 2

## 2016-05-25 MED ORDER — NAPROXEN 500 MG PO TABS: 500.0000 mg | ORAL_TABLET | Freq: Two times a day (BID) | ORAL | 0 refills | Status: DC

## 2016-05-25 NOTE — Discharge Instructions (Signed)
Use ice and heat for comfort. Take the medications as prescribed. Recheck if not improving over the next week.  Return to the ED if it seems worse such as difficulty controlling your urine or BM's, numbness or weakness in your legs.

## 2016-05-25 NOTE — ED Triage Notes (Signed)
Pt c/o left side flank pain after helping his dad yesterday with a well; pt describes the pain as a stabbing pain and states the pain woke him up

## 2016-05-25 NOTE — ED Provider Notes (Signed)
[facility] Provider Note   CSN: [id number] Arrival date & time: 05/25/16  0415  Time seen 05:22 AM   History   Chief Complaint Chief Complaint  Patient presents with  . Back Pain    HPI Jason [name] is a 25 y.o. male.  HPI  patient states he was helping his father 6 a well and when they put the lid back gone he torqued his back about 3:30 PM. He states he had a sharp stabbing pain for about an hour that radiated into his left knee. The pain was on his left side of his back. He states it went away however about 3:30 this morning it woke him up from sleep with similar pain. He states however this time the pain does not radiate into his leg. He's had nausea without vomiting. He denies any urinary or rectal incontinence. He states he's had back problems in past such as simple pulled muscles but this is the worst it's been.  PCP Dr Maggie FontLulking  Past Medical History:  Diagnosis Date  . Asthma   . Hyperinsulinemia   . Hypertension   . Migraine with aura   . Morbid obesity [facility](HCC)     Patient Active Problem List   Diagnosis Date Noted  . Hyperinsulinemia 10/30/2012  . Depression with anxiety 10/30/2012  . Morbid obesity (HCC) 10/30/2012    Past Surgical History:  Procedure Laterality Date  . TONSILLECTOMY         Home Medications    Prior to Admission medications   Medication Sig Start Date End Date Taking? Authorizing Provider  cyclobenzaprine (FLEXERIL) 10 MG tablet Take 1 tablet (10 mg total) by mouth 3 (three) times daily as needed (muscle soreness). 05/25/16   Jason AlbeIva Danaija Eskridge, MD  naproxen (NAPROSYN) 500 MG tablet Take 1 tablet (500 mg total) by mouth 2 (two) times daily. 05/25/16   Jason AlbeIva Darwin Guastella, MD    Family History History reviewed. No pertinent family history.  Social History Social History  Substance Use Topics  . Smoking status: Never Smoker  . Smokeless tobacco: Never Used  . Alcohol use 0.0 oz/week     Comment: occasional  employed   Allergies     Codeine and Adipex-p [phentermine hcl]   Review of Systems Review of Systems  All other systems reviewed and are negative.    Physical Exam Updated Vital Signs BP 170/81 (BP Location: Right Arm)   Pulse 71   Temp 98.1 F (36.7 C) (Oral)   Resp 18   Ht 6' (1.829 m)   Wt (!) 320 lb (145.2 kg)   SpO2 95%   BMI 43.40 kg/m   Vital signs normal except for hypertension   Physical Exam  Constitutional: He appears well-developed and well-nourished. He is cooperative. He appears distressed.  obese  HENT:  Head: Normocephalic and atraumatic.  Eyes: Conjunctivae and EOM are normal. Pupils are equal, round, and reactive to light.  Neck: Normal range of motion and full passive range of motion without pain. Neck supple.  Cardiovascular: Normal rate, regular rhythm and normal heart sounds.   Pulmonary/Chest: Effort normal and breath sounds normal. No tachypnea. No respiratory distress.  Musculoskeletal:       Thoracic back: He exhibits no swelling, no edema and no deformity.       Lumbar back: He exhibits no swelling, no edema and no deformity.  Patellar reflexes +2 and = bilaterally SLR + on the left He has some mild tenderness in the lumbar spine diffusely however  he is extremely tender to palpation over the large paraspinous muscles on the left in the lumbar region. He has pain on changing positions.  Neurological: He has normal strength and normal reflexes. No cranial nerve deficit.  Skin: Skin is warm, dry and intact.  Psychiatric: He has a normal mood and affect. His speech is normal and behavior is normal. Thought content normal.  Nursing note and vitals reviewed.    ED Treatments / Results   Procedures Procedures (including critical care time)  Medications Ordered in ED Medications  ketorolac (TORADOL) injection 60 mg (60 mg Intramuscular Given 05/25/16 0546)  diazepam (VALIUM) tablet 10 mg (10 mg Oral Given 05/25/16 0546)     Initial Impression / Assessment and  Plan / ED Course  I have reviewed the triage vital signs and the nursing notes.  Pertinent labs & imaging results that were available during my care of the patient were reviewed by me and considered in my medical decision making (see chart for details).  Clinical Course     Patient was given IM Toradol and oral Valium (there is a Scientist, clinical (histocompatibility and immunogenetics)nationwide shortage of IV/IM Valium)  At time of discharge patient states he's feeling better.  Final Clinical Impressions(s) / ED Diagnoses   Final diagnoses:  Musculoskeletal back pain    New Prescriptions New Prescriptions   CYCLOBENZAPRINE (FLEXERIL) 10 MG TABLET    Take 1 tablet (10 mg total) by mouth 3 (three) times daily as needed (muscle soreness).   NAPROXEN (NAPROSYN) 500 MG TABLET    Take 1 tablet (500 mg total) by mouth 2 (two) times daily.    Plan discharge  Jason AlbeIva Paris Hohn, MD, Concha PyoFACEP    Marquesha Robideau, MD 05/25/16 [phone]

## 2016-07-22 ENCOUNTER — Ambulatory Visit (INDEPENDENT_AMBULATORY_CARE_PROVIDER_SITE_OTHER): Payer: BLUE CROSS/BLUE SHIELD | Admitting: Family Medicine

## 2016-07-22 ENCOUNTER — Encounter: Payer: Self-pay | Admitting: Family Medicine

## 2016-07-22 VITALS — BP 138/94 | Ht 72.0 in | Wt 373.0 lb

## 2016-07-22 DIAGNOSIS — Z13220 Encounter for screening for lipoid disorders: Secondary | ICD-10-CM | POA: Diagnosis not present

## 2016-07-22 DIAGNOSIS — Z79899 Other long term (current) drug therapy: Secondary | ICD-10-CM

## 2016-07-22 DIAGNOSIS — I10 Essential (primary) hypertension: Secondary | ICD-10-CM | POA: Insufficient documentation

## 2016-07-22 MED ORDER — ENALAPRIL MALEATE 10 MG PO TABS: 10.0000 mg | ORAL_TABLET | Freq: Every day | ORAL | 5 refills | Status: DC

## 2016-07-22 NOTE — Patient Instructions (Signed)
Hypertension Hypertension, commonly called high blood pressure, is when the force of blood pumping through your arteries is too strong. Your arteries are the blood vessels that carry blood from your heart throughout your body. A blood pressure reading consists of a higher number over a lower number, such as 110/72. The higher number (systolic) is the pressure inside your arteries when your heart pumps. The lower number (diastolic) is the pressure inside your arteries when your heart relaxes. Ideally you want your blood pressure below 120/80. Hypertension forces your heart to work harder to pump blood. Your arteries may become narrow or stiff. Having untreated or uncontrolled hypertension can cause heart attack, stroke, kidney disease, and other problems. What increases the risk? Some risk factors for high blood pressure are controllable. Others are not. Risk factors you cannot control include:  Race. You may be at higher risk if you are African American.  Age. Risk increases with age.  Gender. Men are at higher risk than women before age 45 years. After age 65, women are at higher risk than men. Risk factors you can control include:  Not getting enough exercise or physical activity.  Being overweight.  Getting too much fat, sugar, calories, or salt in your diet.  Drinking too much alcohol. What are the signs or symptoms? Hypertension does not usually cause signs or symptoms. Extremely high blood pressure (hypertensive crisis) may cause headache, anxiety, shortness of breath, and nosebleed. How is this diagnosed? To check if you have hypertension, your health care provider will measure your blood pressure while you are seated, with your arm held at the level of your heart. It should be measured at least twice using the same arm. Certain conditions can cause a difference in blood pressure between your right and left arms. A blood pressure reading that is higher than normal on one occasion does  not mean that you need treatment. If it is not clear whether you have high blood pressure, you may be asked to return on a different day to have your blood pressure checked again. Or, you may be asked to monitor your blood pressure at home for 1 or more weeks. How is this treated? Treating high blood pressure includes making lifestyle changes and possibly taking medicine. Living a healthy lifestyle can help lower high blood pressure. You may need to change some of your habits. Lifestyle changes may include:  Following the DASH diet. This diet is high in fruits, vegetables, and whole grains. It is low in salt, red meat, and added sugars.  Keep your sodium intake below 2,300 mg per day.  Getting at least 30-45 minutes of aerobic exercise at least 4 times per week.  Losing weight if necessary.  Not smoking.  Limiting alcoholic beverages.  Learning ways to reduce stress. Your health care provider may prescribe medicine if lifestyle changes are not enough to get your blood pressure under control, and if one of the following is true:  You are 18-59 years of age and your systolic blood pressure is above 140.  You are 60 years of age or older, and your systolic blood pressure is above 150.  Your diastolic blood pressure is above 90.  You have diabetes, and your systolic blood pressure is over 140 or your diastolic blood pressure is over 90.  You have kidney disease and your blood pressure is above 140/90.  You have heart disease and your blood pressure is above 140/90. Your personal target blood pressure may vary depending on your medical   conditions, your age, and other factors. Follow these instructions at home:  Have your blood pressure rechecked as directed by your health care provider.  Take medicines only as directed by your health care provider. Follow the directions carefully. Blood pressure medicines must be taken as prescribed. The medicine does not work as well when you skip  doses. Skipping doses also puts you at risk for problems.  Do not smoke.  Monitor your blood pressure at home as directed by your health care provider. Contact a health care provider if:  You think you are having a reaction to medicines taken.  You have recurrent headaches or feel dizzy.  You have swelling in your ankles.  You have trouble with your vision. Get help right away if:  You develop a severe headache or confusion.  You have unusual weakness, numbness, or feel faint.  You have severe chest or abdominal pain.  You vomit repeatedly.  You have trouble breathing. This information is not intended to replace advice given to you by your health care provider. Make sure you discuss any questions you have with your health care provider. Document Released: 06/06/2005 Document Revised: 11/12/2015 Document Reviewed: 03/29/2013 Elsevier Interactive Patient Education  2017 Elsevier Inc.  

## 2016-07-22 NOTE — Progress Notes (Signed)
   Subjective:    Patient ID: Jason [name], male    DOB: 11/20/1990, 26 y.o.   MRN: [id number]  Hypertension  This is a recurrent problem. Associated symptoms include headaches. (Light headed) Compliance problems: pt states he has been doing better with exercise and going to the gym. diet could be better.    Pt concerned that he only urinates one to two times a day. Drinks a lot of fluids. Unable to get urine sample at office today. Hardly ever urinates, rarel goes   No sig swelling in ankles  Rare alcohol intake  hedaches when b p up   Bp has been spiking   No sig cuff  Strong fam hx htn  Nonsmoker  No sig salt intake  Fair amnt exercise  Strong family history of high blood pressure. Patient admits to poor dietary compliance. Exercising not great  gaiing weight  Review of Systems  Neurological: Positive for headaches.       Objective:   Physical Exam Alert vitals stable blood pressure 140/92 on repeat both arms similar morbid obesity present lungs clear heart rare rhythm ankles without edema       Assessment & Plan:  Impression new-onset hypertension accompanied by features such as headaches and malaise during flares, also complicated by morbid obesity and strong family history of hypertension long discussion held. Patient wishes to get on with medication plan appropriate blood work. Vasotec initiated side effects benefits discussed follow-up as scheduled diet exercise discussed strongly encouraged easily 25 minutes spent most in discussion

## 2016-07-23 DIAGNOSIS — I10 Essential (primary) hypertension: Secondary | ICD-10-CM | POA: Diagnosis not present

## 2016-07-23 DIAGNOSIS — Z13220 Encounter for screening for lipoid disorders: Secondary | ICD-10-CM | POA: Diagnosis not present

## 2016-07-23 DIAGNOSIS — Z79899 Other long term (current) drug therapy: Secondary | ICD-10-CM | POA: Diagnosis not present

## 2016-07-24 ENCOUNTER — Encounter: Payer: Self-pay | Admitting: Family Medicine

## 2016-07-24 LAB — HEPATIC FUNCTION PANEL
ALT: 48 IU/L — ABNORMAL HIGH (ref 0–44)
AST: 25 IU/L (ref 0–40)
Albumin: 4.6 g/dL (ref 3.5–5.5)
Alkaline Phosphatase: 90 IU/L (ref 39–117)
Bilirubin Total: 0.7 mg/dL (ref 0.0–1.2)
Bilirubin, Direct: 0.19 mg/dL (ref 0.00–0.40)
Total Protein: 7.2 g/dL (ref 6.0–8.5)

## 2016-07-24 LAB — BASIC METABOLIC PANEL
BUN/Creatinine Ratio: 12 (ref 9–20)
BUN: 14 mg/dL (ref 6–20)
CO2: 25 mmol/L (ref 18–29)
Calcium: 9.2 mg/dL (ref 8.7–10.2)
Chloride: 102 mmol/L (ref 96–106)
Creatinine, Ser: 1.15 mg/dL (ref 0.76–1.27)
GFR calc Af Amer: 103 mL/min/{1.73_m2} (ref >59)
GFR calc non Af Amer: 89 mL/min/{1.73_m2} (ref >59)
Glucose: 96 mg/dL (ref 65–99)
Potassium: 4.7 mmol/L (ref 3.5–5.2)
Sodium: 144 mmol/L (ref 134–144)

## 2016-07-24 LAB — LIPID PANEL
Chol/HDL Ratio: 3.9 ratio units (ref 0.0–5.0)
Cholesterol, Total: 106 mg/dL (ref 100–199)
HDL: 27 mg/dL — ABNORMAL LOW (ref >39)
LDL Calculated: 56 mg/dL (ref 0–99)
Triglycerides: 115 mg/dL (ref 0–149)
VLDL Cholesterol Cal: 23 mg/dL (ref 5–40)

## 2016-09-08 ENCOUNTER — Emergency Department (HOSPITAL_COMMUNITY)
Admission: EM | Admit: 2016-09-08 | Discharge: 2016-09-08 | Disposition: A | Discharge status: Home / Self Care | Payer: BLUE CROSS/BLUE SHIELD | Attending: Emergency Medicine | Admitting: Emergency Medicine

## 2016-09-08 ENCOUNTER — Encounter (HOSPITAL_COMMUNITY): Payer: Self-pay

## 2016-09-08 DIAGNOSIS — I10 Essential (primary) hypertension: Secondary | ICD-10-CM | POA: Diagnosis not present

## 2016-09-08 DIAGNOSIS — K029 Dental caries, unspecified: Secondary | ICD-10-CM | POA: Diagnosis not present

## 2016-09-08 DIAGNOSIS — K047 Periapical abscess without sinus: Secondary | ICD-10-CM | POA: Insufficient documentation

## 2016-09-08 DIAGNOSIS — J45909 Unspecified asthma, uncomplicated: Secondary | ICD-10-CM | POA: Insufficient documentation

## 2016-09-08 DIAGNOSIS — K0889 Other specified disorders of teeth and supporting structures: Secondary | ICD-10-CM | POA: Diagnosis not present

## 2016-09-08 DIAGNOSIS — Z79899 Other long term (current) drug therapy: Secondary | ICD-10-CM | POA: Diagnosis not present

## 2016-09-08 MED ORDER — CLINDAMYCIN HCL 150 MG PO CAPS
300.0000 mg | ORAL_CAPSULE | Freq: Once | ORAL | Status: AC
  Administered 2016-09-08: 300 mg via ORAL
  Filled 2016-09-08: qty 2

## 2016-09-08 MED ORDER — IBUPROFEN 400 MG PO TABS
600.0000 mg | ORAL_TABLET | Freq: Once | ORAL | Status: AC
  Administered 2016-09-08: 600 mg via ORAL
  Filled 2016-09-08: qty 2

## 2016-09-08 MED ORDER — ACETAMINOPHEN 500 MG PO TABS
1000.0000 mg | ORAL_TABLET | Freq: Once | ORAL | Status: AC
  Administered 2016-09-08: 1000 mg via ORAL
  Filled 2016-09-08: qty 2

## 2016-09-08 MED ORDER — CLINDAMYCIN HCL 300 MG PO CAPS: 300.0000 mg | ORAL_CAPSULE | Freq: Four times a day (QID) | ORAL | 0 refills | Status: DC

## 2016-09-08 NOTE — ED Triage Notes (Signed)
Pt c/o pain to both upper and lower wisdom teeth, states he was seen by his dentist and given a referral for oral surgeon, but states he has not been able to get them on the phone to make an appointment

## 2016-09-08 NOTE — ED Provider Notes (Signed)
[facility] Provider Note   CSN: [id number] Arrival date & time: 09/08/16  0024  Time seen 03:20 am   History   Chief Complaint Chief Complaint  Patient presents with  . Dental Pain    HPI Jason [name] is a 26 y.o. male.  HPI  patient reports he has broken wisdom teeth and he saw a dentist about it about 3 weeks ago. He states she poked around on them and then they started hurting. He was placed on penicillin for a week which seemed to help. However he continues to have pain now. He states he feels like his face is swollen and feels hot for the last couple of days. He denies any fever. He denies difficulty swallowing or breathing. He states eating makes the pain worse however hot or cold air, liquids, or food do not make his pain worse. He has taken nothing for pain. He states his dentist [place] in [place] however he has been calling the office and states no one will answer the phone. He states he let his dentist office know and they said to keep trying.  PCP Lubertha SouthSteve Luking, MD   Past Medical History:  Diagnosis Date  . Asthma   . Hyperinsulinemia   . Hypertension   . Migraine with aura   . Morbid obesity [facility](HCC)     Patient Active Problem List   Diagnosis Date Noted  . Hypertension 07/22/2016  . Hyperinsulinemia 10/30/2012  . Depression with anxiety 10/30/2012  . Morbid obesity (HCC) 10/30/2012    Past Surgical History:  Procedure Laterality Date  . TONSILLECTOMY         Home Medications    Prior to Admission medications   Medication Sig Start Date End Date Taking? Authorizing Provider  enalapril (VASOTEC) 10 MG tablet Take 1 tablet (10 mg total) by mouth at bedtime. 07/22/16  Yes Merlyn AlbertWilliam S Luking, MD  clindamycin (CLEOCIN) 300 MG capsule Take 1 capsule (300 mg total) by mouth every 6 (six) hours. 09/08/16   Devoria AlbeIva Noemi Ishmael, MD    Family History No family history on file.  Social History Social History  Substance Use Topics  .  Smoking status: Never Smoker  . Smokeless tobacco: Never Used  . Alcohol use 0.0 oz/week     Comment: occasional     Allergies   Codeine and Adipex-p [phentermine hcl]   Review of Systems Review of Systems  All other systems reviewed and are negative.    Physical Exam Updated Vital Signs BP (!) 176/94 (BP Location: Right Arm)   Pulse 89   Temp 98.6 F (37 C) (Oral)   Resp 20   Ht 6' (1.829 m)   Wt (!) 340 lb (154.2 kg)   SpO2 98%   BMI 46.11 kg/m   Vital signs normal except for hypertension   Physical Exam  Constitutional: He is oriented to person, place, and time. He appears well-developed and well-nourished.  HENT:  Head: Normocephalic and atraumatic.  Right Ear: External ear normal.  Left Ear: External ear normal.  Mouth/Throat:    There is no increased lymphadenopathy, there is no induration in the subcutaneous tissue around those teeth.  Eyes: Conjunctivae and EOM are normal.  Neck: Normal range of motion.  Cardiovascular: Normal rate.   Pulmonary/Chest: Effort normal. No respiratory distress.  Musculoskeletal: Normal range of motion.  Neurological: He is alert and oriented to person, place, and time. No cranial nerve deficit.  Skin: Skin is warm and  dry. No rash noted.  Face is mildly flushed     ED Treatments / Results   Procedures Procedures (including critical care time)  Medications Ordered in ED Medications  ibuprofen (ADVIL,MOTRIN) tablet 600 mg (not administered)  acetaminophen (TYLENOL) tablet 1,000 mg (not administered)  clindamycin (CLEOCIN) capsule 300 mg (not administered)     Initial Impression / Assessment and Plan / ED Course  I have reviewed the triage vital signs and the nursing notes.  Pertinent labs & imaging results that were available during my care of the patient were reviewed by me and considered in my medical decision making (see chart for details).  Patient was started on clindamycin orally. He states he didn't  want anything stronger than Motrin or Tylenol. He was given several other phone numbers for oral surgeons to follow-up with if he cannot get through to the one in his dentist referred him to. At this time patient has no obvious facial asymmetry, there is no enlarged lymphadenopathy or areas of induration in the subcutaneous tissues. He has no difficulty breathing or swallowing. CT scan was not done due for these reasons. Hopefully oral antibiotics will improve his symptoms until he can get definitive dental care.  Final Clinical Impressions(s) / ED Diagnoses   Final diagnoses:  Dental caries  Pain, dental  Dental infection    New Prescriptions New Prescriptions   CLINDAMYCIN (CLEOCIN) 300 MG CAPSULE    Take 1 capsule (300 mg total) by mouth every 6 (six) hours.    Plan discharge  Devoria Albe, MD, Concha Pyo, MD 09/08/16 [phone]

## 2016-09-08 NOTE — Discharge Instructions (Signed)
Take the antibiotics until gone. Take ibuprofen 600 mg + acetaminophen 1000 mg 4 times a day for pain. You will need to follow up with an oral surgeon as already discussed with your dentist. Recheck if you get fever, have difficulty breathing or swallowing or get swelling in your face.

## 2016-10-28 ENCOUNTER — Encounter: Payer: Self-pay | Admitting: Family Medicine

## 2016-11-13 ENCOUNTER — Emergency Department (HOSPITAL_COMMUNITY)
Admission: EM | Admit: 2016-11-13 | Discharge: 2016-11-13 | Disposition: A | Discharge status: Home / Self Care | Payer: BLUE CROSS/BLUE SHIELD | Attending: Emergency Medicine | Admitting: Emergency Medicine

## 2016-11-13 ENCOUNTER — Encounter (HOSPITAL_COMMUNITY): Payer: Self-pay | Admitting: Emergency Medicine

## 2016-11-13 DIAGNOSIS — M10071 Idiopathic gout, right ankle and foot: Secondary | ICD-10-CM | POA: Diagnosis not present

## 2016-11-13 DIAGNOSIS — M25571 Pain in right ankle and joints of right foot: Secondary | ICD-10-CM | POA: Diagnosis present

## 2016-11-13 DIAGNOSIS — Z79899 Other long term (current) drug therapy: Secondary | ICD-10-CM | POA: Insufficient documentation

## 2016-11-13 DIAGNOSIS — J45909 Unspecified asthma, uncomplicated: Secondary | ICD-10-CM | POA: Insufficient documentation

## 2016-11-13 DIAGNOSIS — I10 Essential (primary) hypertension: Secondary | ICD-10-CM

## 2016-11-13 DIAGNOSIS — M1000 Idiopathic gout, unspecified site: Secondary | ICD-10-CM

## 2016-11-13 HISTORY — DX: Gout, unspecified: M10.9

## 2016-11-13 MED ORDER — KETOROLAC TROMETHAMINE 10 MG PO TABS
10.0000 mg | ORAL_TABLET | Freq: Once | ORAL | Status: AC
  Administered 2016-11-13: 10 mg via ORAL
  Filled 2016-11-13: qty 1

## 2016-11-13 MED ORDER — INDOMETHACIN 25 MG PO CAPS: 25.0000 mg | ORAL_CAPSULE | Freq: Three times a day (TID) | ORAL | 0 refills | Status: DC | PRN

## 2016-11-13 MED ORDER — TRAMADOL HCL 50 MG PO TABS: 50.0000 mg | ORAL_TABLET | Freq: Four times a day (QID) | ORAL | 0 refills | Status: DC | PRN

## 2016-11-13 MED ORDER — PROMETHAZINE HCL 12.5 MG PO TABS
25.0000 mg | ORAL_TABLET | Freq: Once | ORAL | Status: AC
  Administered 2016-11-13: 25 mg via ORAL
  Filled 2016-11-13: qty 2

## 2016-11-13 MED ORDER — DEXAMETHASONE 4 MG PO TABS: 4.0000 mg | ORAL_TABLET | Freq: Two times a day (BID) | ORAL | 0 refills | Status: DC

## 2016-11-13 MED ORDER — PREDNISONE 50 MG PO TABS
60.0000 mg | ORAL_TABLET | Freq: Once | ORAL | Status: AC
  Administered 2016-11-13: 60 mg via ORAL
  Filled 2016-11-13: qty 1

## 2016-11-13 MED ORDER — TRAMADOL HCL 50 MG PO TABS
100.0000 mg | ORAL_TABLET | Freq: Once | ORAL | Status: AC
  Administered 2016-11-13: 100 mg via ORAL
  Filled 2016-11-13: qty 2

## 2016-11-13 NOTE — Discharge Instructions (Signed)
Please stay away from alcohol. Please follow low Purine diet. Use Steroid and NSAID.daily. Use ultram for pain if needed. Follow up with Dr Gerda DissLuking for additional evaluation and management. Your b/p is elevated. Please have this rechecked.

## 2016-11-13 NOTE — ED Provider Notes (Signed)
[facility]-EMER[facility]ENCY DEPT Provider Note   CSN: [id number] Arrival date & time: 11/13/16  1537  By signing my name below, I, Jason [name], attest that this documentation has been prepared under the direction and in the presence of Ivery Quale, [place] Electronically Signed: Deland [name], ED Scribe. 11/13/16. 4:37 PM.  History   Chief Complaint Chief Complaint  Patient presents with  . Gout   The history is provided by the patient. No language interpreter was used.  Ankle Pain   The incident occurred 2 days ago. The incident occurred at home. There was no injury mechanism. The pain is present in the right ankle. The pain is moderate. The pain has been constant since onset. He reports no foreign bodies present. He has tried acetaminophen for the symptoms. The treatment provided no relief.     HPI Comments: Jason [name] is a 26 y.o. male, with a PMHx of gout, presents to the Emergency Department complaining of moderate, gradually worsening right ankle pain that began 2 days ago. His symptoms feel very similar to a recent a gout flare that occurred in 12/2015. He reports that his PCP has not prescribed maintenance medications since that flare. The pt additionally reports that he has taken ibuprofen for his symptoms with inadequate relief. He admits that he drank six beers for the first time in several months.  Past Medical History:  Diagnosis Date  . Asthma   . Gout   . Hyperinsulinemia   . Hypertension   . Migraine with aura   . Morbid obesity [facility])     Patient Active Problem List   Diagnosis Date Noted  . Hypertension 07/22/2016  . Hyperinsulinemia 10/30/2012  . Depression with anxiety 10/30/2012  . Morbid obesity (HCC) 10/30/2012    Past Surgical History:  Procedure Laterality Date  . TONSILLECTOMY         Home Medications    Prior to Admission medications   Medication Sig Start Date End Date Taking? Authorizing Provider  clindamycin (CLEOCIN) 300 MG capsule Take 1  capsule (300 mg total) by mouth every 6 (six) hours. 09/08/16   Devoria Albe, MD  enalapril (VASOTEC) 10 MG tablet Take 1 tablet (10 mg total) by mouth at bedtime. 07/22/16   Merlyn Albert, MD    Family History No family history on file.  Social History Social History  Substance Use Topics  . Smoking status: Never Smoker  . Smokeless tobacco: Never Used  . Alcohol use 0.0 oz/week     Comment: occasional     Allergies   Codeine and Adipex-p [phentermine hcl]   Review of Systems Review of Systems  Musculoskeletal: Positive for arthralgias and joint swelling. Negative for myalgias.  All other systems reviewed and are negative.    Physical Exam Updated Vital Signs BP (!) 153/109 (BP Location: Right Arm)   Pulse (!) 118   Temp 98.1 F (36.7 C) (Oral)   Resp 20   Ht 6' (1.829 m)   Wt (!) 330 lb (149.7 kg)   SpO2 99%   BMI 44.76 kg/m   Physical Exam  Constitutional: He is oriented to person, place, and time. He appears well-developed and well-nourished.  HENT:  Head: Normocephalic.  Eyes: EOM are normal.  Neck: Normal range of motion.  Cardiovascular: Normal rate, regular rhythm, normal heart sounds and intact distal pulses.  Exam reveals no gallop and no friction rub.   No murmur heard. Pulmonary/Chest: Effort normal. No respiratory distress. He has no wheezes. He has no  rales. He exhibits no tenderness.  Abdominal: He exhibits no distension.  Musculoskeletal: Normal range of motion.  Dorsalis pedis of RLE is 2+ Pain to the lateral malleolus to the right ankle Soreness along achilles tendon but no evidence of disruption Mild soreness of the medial malleolus Capillary refill is less than 2 Full ROM of toes No pitting or edema of RLE No temperature changes  Neurological: He is alert and oriented to person, place, and time.  Skin: Capillary refill takes less than 2 seconds.  Psychiatric: He has a normal mood and affect.  Nursing note and vitals reviewed.    ED  Treatments / Results   DIAGNOSTIC STUDIES: Oxygen Saturation is 99% on RA, normal by my interpretation.   COORDINATION OF CARE: 4:14 PM-Discussed next steps with pt. Pt verbalized understanding and is agreeable with the plan.   Labs (all labs ordered are listed, but only abnormal results are displayed) Labs Reviewed - No data to display  EKG  EKG Interpretation None       Radiology No results found.  Procedures Procedures (including critical care time)  Medications Ordered in ED Medications - No data to display   Initial Impression / Assessment and Plan / ED Course  I have reviewed the triage vital signs and the nursing notes.  Pertinent labs & imaging results that were available during my care of the patient were reviewed by me and considered in my medical decision making (see chart for details).       Final Clinical Impressions(s) / ED Diagnoses   MDM Pt with history of gout. Presents with 2-3 days of ankle pain and patient states that it feels similar to previous gout attacks. He reports drinking beer and being poorly compliant with his diet. No evidence for septic joint. No neurovascular disorder. Pt will be treated with oral steroids, NSAID and pain medication. He is to see his PCP for additional follow-up.   Final diagnoses:  Acute idiopathic gout, unspecified site  Essential hypertension    New Prescriptions New Prescriptions   No medications on file   **I personally performed the services described in this documentation, which was scribed in my presence. The recorded information has been reviewed and is accurate.Ivery Quale*    Malakhi Markwood, PA-C 11/13/16 1647    Donnetta Hutchingook, Brian, MD 11/20/16 [phone]

## 2016-11-13 NOTE — ED Triage Notes (Signed)
Gout flare to rt foot x 2 days

## 2016-11-18 ENCOUNTER — Ambulatory Visit (INDEPENDENT_AMBULATORY_CARE_PROVIDER_SITE_OTHER): Payer: BLUE CROSS/BLUE SHIELD | Admitting: Nurse Practitioner

## 2016-11-18 ENCOUNTER — Encounter: Payer: Self-pay | Admitting: Nurse Practitioner

## 2016-11-18 VITALS — BP 154/108 | HR 84 | Ht 76.0 in | Wt 364.0 lb

## 2016-11-18 DIAGNOSIS — I10 Essential (primary) hypertension: Secondary | ICD-10-CM

## 2016-11-18 DIAGNOSIS — F428 Other obsessive-compulsive disorder: Secondary | ICD-10-CM | POA: Diagnosis not present

## 2016-11-18 DIAGNOSIS — F418 Other specified anxiety disorders: Secondary | ICD-10-CM

## 2016-11-18 MED ORDER — SERTRALINE HCL 50 MG PO TABS: 50.0000 mg | ORAL_TABLET | Freq: Every day | ORAL | 2 refills | Status: DC

## 2016-11-18 MED ORDER — HYDROCHLOROTHIAZIDE 25 MG PO TABS: 25.0000 mg | ORAL_TABLET | Freq: Every day | ORAL | 2 refills | Status: DC

## 2016-11-18 NOTE — Patient Instructions (Signed)
DASH Eating Plan DASH stands for "Dietary Approaches to Stop Hypertension." The DASH eating plan is a healthy eating plan that has been shown to reduce high blood pressure (hypertension). It may also reduce your risk for type 2 diabetes, heart disease, and stroke. The DASH eating plan may also help with weight loss. What are tips for following this plan? General guidelines  Avoid eating more than 2,300 mg (milligrams) of salt (sodium) a day. If you have hypertension, you may need to reduce your sodium intake to 1,500 mg a day.  Limit alcohol intake to no more than 1 drink a day for nonpregnant women and 2 drinks a day for men. One drink equals 12 oz of beer, 5 oz of wine, or 1 oz of hard liquor.  Work with your health care provider to maintain a healthy body weight or to lose weight. Ask what an ideal weight is for you.  Get at least 30 minutes of exercise that causes your heart to beat faster (aerobic exercise) most days of the week. Activities may include walking, swimming, or biking.  Work with your health care provider or diet and nutrition specialist (dietitian) to adjust your eating plan to your individual calorie needs. Reading food labels  Check food labels for the amount of sodium per serving. Choose foods with less than 5 percent of the Daily Value of sodium. Generally, foods with less than 300 mg of sodium per serving fit into this eating plan.  To find whole grains, look for the word "whole" as the first word in the ingredient list. Shopping  Buy products labeled as "low-sodium" or "no salt added."  Buy fresh foods. Avoid canned foods and premade or frozen meals. Cooking  Avoid adding salt when cooking. Use salt-free seasonings or herbs instead of table salt or sea salt. Check with your health care provider or pharmacist before using salt substitutes.  Do not fry foods. Cook foods using healthy methods such as baking, boiling, grilling, and broiling instead.  Cook with  heart-healthy oils, such as olive, canola, soybean, or sunflower oil. Meal planning   Eat a balanced diet that includes: ? 5 or more servings of fruits and vegetables each day. At each meal, try to fill half of your plate with fruits and vegetables. ? Up to 6-8 servings of whole grains each day. ? Less than 6 oz of lean meat, poultry, or fish each day. A 3-oz serving of meat is about the same size as a deck of cards. One egg equals 1 oz. ? 2 servings of low-fat dairy each day. ? A serving of nuts, seeds, or beans 5 times each week. ? Heart-healthy fats. Healthy fats called Omega-3 fatty acids are found in foods such as flaxseeds and coldwater fish, like sardines, salmon, and mackerel.  Limit how much you eat of the following: ? Canned or prepackaged foods. ? Food that is high in trans fat, such as fried foods. ? Food that is high in saturated fat, such as fatty meat. ? Sweets, desserts, sugary drinks, and other foods with added sugar. ? Full-fat dairy products.  Do not salt foods before eating.  Try to eat at least 2 vegetarian meals each week.  Eat more home-cooked food and less restaurant, buffet, and fast food.  When eating at a restaurant, ask that your food be prepared with less salt or no salt, if possible. What foods are recommended? The items listed may not be a complete list. Talk with your dietitian about what   dietary choices are best for you. Grains Whole-grain or whole-wheat bread. Whole-grain or whole-wheat pasta. Brown rice. Oatmeal. Quinoa. Bulgur. Whole-grain and low-sodium cereals. Pita bread. Low-fat, low-sodium crackers. Whole-wheat flour tortillas. Vegetables Fresh or frozen vegetables (raw, steamed, roasted, or grilled). Low-sodium or reduced-sodium tomato and vegetable juice. Low-sodium or reduced-sodium tomato sauce and tomato paste. Low-sodium or reduced-sodium canned vegetables. Fruits All fresh, dried, or frozen fruit. Canned fruit in natural juice (without  added sugar). Meat and other protein foods Skinless chicken or turkey. Ground chicken or turkey. Pork with fat trimmed off. Fish and seafood. Egg whites. Dried beans, peas, or lentils. Unsalted nuts, nut butters, and seeds. Unsalted canned beans. Lean cuts of beef with fat trimmed off. Low-sodium, lean deli meat. Dairy Low-fat (1%) or fat-free (skim) milk. Fat-free, low-fat, or reduced-fat cheeses. Nonfat, low-sodium ricotta or cottage cheese. Low-fat or nonfat yogurt. Low-fat, low-sodium cheese. Fats and oils Soft margarine without trans fats. Vegetable oil. Low-fat, reduced-fat, or light mayonnaise and salad dressings (reduced-sodium). Canola, safflower, olive, soybean, and sunflower oils. Avocado. Seasoning and other foods Herbs. Spices. Seasoning mixes without salt. Unsalted popcorn and pretzels. Fat-free sweets. What foods are not recommended? The items listed may not be a complete list. Talk with your dietitian about what dietary choices are best for you. Grains Baked goods made with fat, such as croissants, muffins, or some breads. Dry pasta or rice meal packs. Vegetables Creamed or fried vegetables. Vegetables in a cheese sauce. Regular canned vegetables (not low-sodium or reduced-sodium). Regular canned tomato sauce and paste (not low-sodium or reduced-sodium). Regular tomato and vegetable juice (not low-sodium or reduced-sodium). Pickles. Olives. Fruits Canned fruit in a light or heavy syrup. Fried fruit. Fruit in cream or butter sauce. Meat and other protein foods Fatty cuts of meat. Ribs. Fried meat. Bacon. Sausage. Bologna and other processed lunch meats. Salami. Fatback. Hotdogs. Bratwurst. Salted nuts and seeds. Canned beans with added salt. Canned or smoked fish. Whole eggs or egg yolks. Chicken or turkey with skin. Dairy Whole or 2% milk, cream, and half-and-half. Whole or full-fat cream cheese. Whole-fat or sweetened yogurt. Full-fat cheese. Nondairy creamers. Whipped toppings.  Processed cheese and cheese spreads. Fats and oils Butter. Stick margarine. Lard. Shortening. Ghee. Bacon fat. Tropical oils, such as coconut, palm kernel, or palm oil. Seasoning and other foods Salted popcorn and pretzels. Onion salt, garlic salt, seasoned salt, table salt, and sea salt. Worcestershire sauce. Tartar sauce. Barbecue sauce. Teriyaki sauce. Soy sauce, including reduced-sodium. Steak sauce. Canned and packaged gravies. Fish sauce. Oyster sauce. Cocktail sauce. Horseradish that you find on the shelf. Ketchup. Mustard. Meat flavorings and tenderizers. Bouillon cubes. Hot sauce and Tabasco sauce. Premade or packaged marinades. Premade or packaged taco seasonings. Relishes. Regular salad dressings. Where to find more information:  National Heart, Lung, and Blood Institute: www.nhlbi.nih.gov  American Heart Association: www.heart.org Summary  The DASH eating plan is a healthy eating plan that has been shown to reduce high blood pressure (hypertension). It may also reduce your risk for type 2 diabetes, heart disease, and stroke.  With the DASH eating plan, you should limit salt (sodium) intake to 2,300 mg a day. If you have hypertension, you may need to reduce your sodium intake to 1,500 mg a day.  When on the DASH eating plan, aim to eat more fresh fruits and vegetables, whole grains, lean proteins, low-fat dairy, and heart-healthy fats.  Work with your health care provider or diet and nutrition specialist (dietitian) to adjust your eating plan to your individual   calorie needs. This information is not intended to replace advice given to you by your health care provider. Make sure you discuss any questions you have with your health care provider. Document Released: 05/26/2011 Document Revised: 05/30/2016 Document Reviewed: 05/30/2016 Elsevier Interactive Patient Education  2017 Elsevier Inc.  

## 2016-11-19 ENCOUNTER — Encounter: Payer: Self-pay | Admitting: Nurse Practitioner

## 2016-11-19 DIAGNOSIS — F428 Other obsessive-compulsive disorder: Secondary | ICD-10-CM | POA: Insufficient documentation

## 2016-11-19 NOTE — Progress Notes (Signed)
Subjective:  Presents for recheck of his BP. Has had elevated BP for years. Went to ED recently for gout which has improved. BP at that time 153/109. Was not taking enalapril on a regular basis until recently. Occasional headache when BP is elevated. No CP/ischemic type pain or SOB. No edema. No numbness or weakness of face, arms or legs. No difficulty speaking or swallowing. Has an active job. Was working out about 4 x per week and had lost some weight but this has stopped. Under extreme stress due to child custody issues for his son dealing with his ex girlfriend. Is currently is a good relationship. Having obsessive thoughts; for example paperwork has to be perfect at work. Emotional lability. Fatigue. Weight gain. Denies suicidal or homicidal thoughts or ideation. Strong family history of HTN.   Objective:   BP (!) 154/108   Pulse 84   Ht 6\' 4"  (1.93 m)   Wt (!) 364 lb (165.1 kg)   BMI 44.31 kg/m  NAD. Alert, oriented. Lungs clear. Heart RRR. Carotids no bruits or thrills. LE no edema. Thoughts logical, coherent and relevant. Calm affect. Making good eye contact.  Results for orders placed or performed in visit on 07/22/16  Lipid panel  Result Value Ref Range   Cholesterol, Total 106 100 - 199 mg/dL   Triglycerides 161115 0 - 149 mg/dL   HDL 27 (L) >09>39 mg/dL   VLDL Cholesterol Cal 23 5 - 40 mg/dL   LDL Calculated 56 0 - 99 mg/dL   Chol/HDL Ratio 3.9 0.0 - 5.0 ratio units  Hepatic function panel  Result Value Ref Range   Total Protein 7.2 6.0 - 8.5 g/dL   Albumin 4.6 3.5 - 5.5 g/dL   Bilirubin Total 0.7 0.0 - 1.2 mg/dL   Bilirubin, Direct 6.040.19 0.00 - 0.40 mg/dL   Alkaline Phosphatase 90 39 - 117 IU/L   AST 25 0 - 40 IU/L   ALT 48 (H) 0 - 44 IU/L  Basic metabolic panel  Result Value Ref Range   Glucose 96 65 - 99 mg/dL   BUN 14 6 - 20 mg/dL   Creatinine, Ser 5.401.15 0.76 - 1.27 mg/dL   GFR calc non Af Amer 89 >59 mL/min/1.73   GFR calc Af Amer 103 >59 mL/min/1.73   BUN/Creatinine Ratio  12 9 - 20   Sodium 144 134 - 144 mmol/L   Potassium 4.7 3.5 - 5.2 mmol/L   Chloride 102 96 - 106 mmol/L   CO2 25 18 - 29 mmol/L   Calcium 9.2 8.7 - 10.2 mg/dL     Assessment:   Problem List Items Addressed This Visit      Cardiovascular and Mediastinum   Hypertension - Primary   Relevant Medications   hydrochlorothiazide (HYDRODIURIL) 25 MG tablet     Other   Depression with anxiety   Morbid obesity (HCC)   Obsessive thinking       Plan:   Meds ordered this encounter  Medications  . sertraline (ZOLOFT) 50 MG tablet    Sig: Take 1 tablet (50 mg total) by mouth daily.    Dispense:  30 tablet    Refill:  2    Order Specific Question:   Supervising Provider    Answer:   Merlyn AlbertLUKING, WILLIAM S [[id number]]  . hydrochlorothiazide (HYDRODIURIL) 25 MG tablet    Sig: Take 1 tablet (25 mg total) by mouth daily.    Dispense:  30 tablet    Refill:  2  Order Specific Question:   Supervising Provider    Answer:   Merlyn Albert [[id number]]   Start low dose Zoloft with plans to slowly titrate dose as needed. Encouraged patient to sign up for my chart to give feedback. Reviewed potential adverse effects. DC med and contact office if any problems.  Add HCTZ to regimen. Reviewed lifestyle measures to help HTN. Encouraged DASH diet, weight loss, exercise and stress reduction.  Return in about 1 month (around 12/18/2016) for recheck.

## 2017-01-19 ENCOUNTER — Emergency Department (HOSPITAL_COMMUNITY)
Admission: EM | Admit: 2017-01-19 | Discharge: 2017-01-19 | Disposition: A | Discharge status: Home / Self Care | Payer: BLUE CROSS/BLUE SHIELD | Attending: Emergency Medicine | Admitting: Emergency Medicine

## 2017-01-19 ENCOUNTER — Emergency Department (HOSPITAL_COMMUNITY)
Admission: EM | Admit: 2017-01-19 | Discharge: 2017-01-20 | Disposition: A | Discharge status: Home / Self Care | Payer: BLUE CROSS/BLUE SHIELD | Source: Home / Self Care | Attending: Emergency Medicine | Admitting: Emergency Medicine

## 2017-01-19 ENCOUNTER — Emergency Department (HOSPITAL_COMMUNITY): Payer: BLUE CROSS/BLUE SHIELD

## 2017-01-19 ENCOUNTER — Encounter (HOSPITAL_COMMUNITY): Payer: Self-pay

## 2017-01-19 ENCOUNTER — Encounter (HOSPITAL_COMMUNITY): Payer: Self-pay | Admitting: Emergency Medicine

## 2017-01-19 DIAGNOSIS — Y30XXXA Falling, jumping or pushed from a high place, undetermined intent, initial encounter: Secondary | ICD-10-CM

## 2017-01-19 DIAGNOSIS — S93402A Sprain of unspecified ligament of left ankle, initial encounter: Secondary | ICD-10-CM | POA: Insufficient documentation

## 2017-01-19 DIAGNOSIS — Y999 Unspecified external cause status: Secondary | ICD-10-CM | POA: Insufficient documentation

## 2017-01-19 DIAGNOSIS — Y9389 Activity, other specified: Secondary | ICD-10-CM | POA: Diagnosis not present

## 2017-01-19 DIAGNOSIS — Z79899 Other long term (current) drug therapy: Secondary | ICD-10-CM | POA: Insufficient documentation

## 2017-01-19 DIAGNOSIS — J45909 Unspecified asthma, uncomplicated: Secondary | ICD-10-CM | POA: Insufficient documentation

## 2017-01-19 DIAGNOSIS — Y929 Unspecified place or not applicable: Secondary | ICD-10-CM

## 2017-01-19 DIAGNOSIS — I10 Essential (primary) hypertension: Secondary | ICD-10-CM | POA: Insufficient documentation

## 2017-01-19 DIAGNOSIS — W228XXA Striking against or struck by other objects, initial encounter: Secondary | ICD-10-CM | POA: Insufficient documentation

## 2017-01-19 DIAGNOSIS — Y9339 Activity, other involving climbing, rappelling and jumping off: Secondary | ICD-10-CM | POA: Insufficient documentation

## 2017-01-19 DIAGNOSIS — S99912A Unspecified injury of left ankle, initial encounter: Secondary | ICD-10-CM | POA: Diagnosis not present

## 2017-01-19 MED ORDER — TRAMADOL HCL 50 MG PO TABS
50.0000 mg | ORAL_TABLET | Freq: Four times a day (QID) | ORAL | 0 refills | Status: DC | PRN
Start: 2017-01-19 — End: 2018-01-15

## 2017-01-19 MED ORDER — HYDROCODONE-ACETAMINOPHEN 5-325 MG PO TABS: ORAL_TABLET | ORAL | 0 refills | Status: DC
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
## 2017-01-19 NOTE — ED Triage Notes (Signed)
Lt ankle pain x 2 weeks. Seen yesterday for same.  States pain is worse now

## 2017-01-19 NOTE — Discharge Instructions (Signed)
Elevate and continue ice and ibuprofen.  Call one of the orthopedic doctors listed to arrange a follow-up appt

## 2017-01-19 NOTE — Discharge Instructions (Signed)
Wear the ankle splint orthotic as needed. Continue to apply ice several times a day. Continue taking ibuprofen for pain. You may also take acetaminophen along with the ibuprofen to get better pain relief. Reserve tramadol for pain not controlled with ibuprofen plus acetaminophen.  Activity as tolerated.
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
## 2017-01-19 NOTE — ED Provider Notes (Signed)
[facility]Y DEPT Provider Note   CSN: [id number] Arrival date & time: 01/19/17  2254     History   Chief Complaint Chief Complaint  Patient presents with  . Ankle Pain    HPI Jason [name] is a 26 y.o. male.  HPI   Jason [name] is a 26 y.o. male who presents to the Emergency Department complaining of persistent left ankle pain.  He was seen here last evening for same.  He reports an injury to the ankle 2 weeks ago after jumping off a car.  He describes an inversion injury to the ankle that has progressively worsened.  He was given an ASO brace last evening and X rays were performed.  He has applied ice, taken tylenol and ibuprofen for pain without relief.  Pain worse with weight bearing.  He has continued to stand at his job, which he contributes to the persistent pain.  He denies increased swelling, numbness, knee pain and discoloration.    Past Medical History:  Diagnosis Date  . Asthma   . Gout   . Hyperinsulinemia   . Hypertension   . Migraine with aura   . Morbid obesity [facility](HCC)     Patient Active Problem List   Diagnosis Date Noted  . Obsessive thinking 11/19/2016  . Hypertension 07/22/2016  . Hyperinsulinemia 10/30/2012  . Depression with anxiety 10/30/2012  . Morbid obesity (HCC) 10/30/2012    Past Surgical History:  Procedure Laterality Date  . TONSILLECTOMY         Home Medications    Prior to Admission medications   Medication Sig Start Date End Date Taking? Authorizing Provider  enalapril (VASOTEC) 10 MG tablet Take 1 tablet (10 mg total) by mouth at bedtime. 07/22/16   Merlyn AlbertLuking, William S, MD  hydrochlorothiazide (HYDRODIURIL) 25 MG tablet Take 1 tablet (25 mg total) by mouth daily. 11/18/16   Campbell RichesHoskins, Carolyn C, NP  sertraline (ZOLOFT) 50 MG tablet Take 1 tablet (50 mg total) by mouth daily. 11/18/16 11/18/17  Campbell RichesHoskins, Carolyn C, NP  traMADol (ULTRAM) 50 MG tablet Take 1 tablet (50 mg total) by mouth every 6 (six) hours as needed. 01/19/17   Dione BoozeGlick,  David, MD    Family History Family History  Problem Relation Age of Onset  . Hypertension Father   . Hypertension Maternal Grandfather   . Hypertension Paternal Grandmother     Social History Social History  Substance Use Topics  . Smoking status: Never Smoker  . Smokeless tobacco: Never Used  . Alcohol use 0.0 oz/week     Comment: occasional     Allergies   Codeine and Adipex-p [phentermine hcl]   Review of Systems Review of Systems  Constitutional: Negative for chills and fever.  Musculoskeletal: Positive for arthralgias (left ankle pain). Negative for joint swelling.  Skin: Negative for color change and wound.  Neurological: Negative for weakness and numbness.  All other systems reviewed and are negative.    Physical Exam Updated Vital Signs BP (!) 154/110 (BP Location: Right Wrist)   Pulse 99   Temp 98.7 F (37.1 C) (Oral)   Resp 20   Ht 6' (1.829 m)   Wt (!) 147.4 kg (325 lb)   SpO2 99%   BMI 44.08 kg/m   Physical Exam  Constitutional: He is oriented to person, place, and time. He appears well-developed and well-nourished. No distress.  HENT:  Head: Normocephalic and atraumatic.  Cardiovascular: Normal rate, regular rhythm and intact distal pulses.   Pulmonary/Chest: Effort normal  and breath sounds normal.  Musculoskeletal: He exhibits tenderness. He exhibits no deformity.  Diffuse ttp of the lateral and medial left ankle.  No edema,  erythema, bruising or bony deformity.  No proximal tenderness. Compartments are soft.   Neurological: He is alert and oriented to person, place, and time. No sensory deficit. He exhibits normal muscle tone. Coordination normal.  Skin: Skin is warm and dry. Capillary refill takes less than 2 seconds.  Nursing note and vitals reviewed.    ED Treatments / Results  Labs (all labs ordered are listed, but only abnormal results are displayed) Labs Reviewed - No data to display  EKG  EKG Interpretation None        Radiology Dg Ankle Complete Left  Result Date: 01/19/2017 CLINICAL DATA:  26 year old male with injury to the left ankle. EXAM: LEFT ANKLE COMPLETE - 3+ VIEW COMPARISON:  Left ankle radiograph dated 12/17/2014 FINDINGS: There is no acute fracture or dislocation. The bones are well mineralized. No arthritic changes. There is mild diffuse subcutaneous edema. IMPRESSION: No acute/traumatic osseous pathology. Electronically Signed   By: Elgie CollardArash  Radparvar M.D.   On: 01/19/2017 03:37    Procedures Procedures (including critical care time)  Medications Ordered in ED Medications - No data to display   Initial Impression / Assessment and Plan / ED Course  I have reviewed the triage vital signs and the nursing notes.  Pertinent labs & imaging results that were available during my care of the patient were reviewed by me and considered in my medical decision making (see chart for details).     XR's from one day ago were reviewed by me.  NV intact.    Pt agrees to continue ice, elevation, and will try posterior splint since pt's sx's not improving with ASO.  Pt is obese and not comfortable with crutches. He will try to contact local orthopedics  Final Clinical Impressions(s) / ED Diagnoses   Final diagnoses:  Sprain of left ankle, unspecified ligament, initial encounter    New Prescriptions New Prescriptions   No medications on file     Rosey Bathriplett, Ashleymarie Granderson, PA-C 01/20/17 0031    Dione BoozeGlick, David, MD 01/20/17 [phone]

## 2017-01-20 MED ORDER — HYDROCODONE-ACETAMINOPHEN 5-325 MG PO TABS: ORAL_TABLET | ORAL | 0 refills | Status: DC

## 2017-01-25 ENCOUNTER — Ambulatory Visit (INDEPENDENT_AMBULATORY_CARE_PROVIDER_SITE_OTHER): Payer: BLUE CROSS/BLUE SHIELD | Admitting: Orthopaedic Surgery

## 2017-01-25 ENCOUNTER — Encounter: Payer: Self-pay | Admitting: Orthopaedic Surgery

## 2017-01-25 VITALS — BP 180/118 | HR 88 | Temp 97.6°F | Ht 70.0 in | Wt 360.0 lb

## 2017-01-25 DIAGNOSIS — S96912A Strain of unspecified muscle and tendon at ankle and foot level, left foot, initial encounter: Secondary | ICD-10-CM

## 2017-01-25 NOTE — Progress Notes (Signed)
Subjective:    Patient ID: Jason [name], male    DOB: Aug 28, 1990, 26 y.o.   MRN: [id number]  HPI He hurt his left ankle about three weeks ago after jumping off a car.  He went to the ER on 01-19-17 in the early morning hours and later that evening again because the brace they gave him hurt him and did not help.  He had x-rays which were negative.  I have reviewed the ER records, x-rays and the x-ray reports.  He continues to have lateral left ankle pain and swelling. He was given a posterior splint which wore out.  He is a very large man.  They could not fit him with a CAM walker in the hospital.    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  He has continued difficulty and swelling.  He has tried ice, elevation and Advil.   Review of Systems  HENT: Negative for congestion.   Respiratory: Positive for shortness of breath. Negative for cough.   Cardiovascular: Negative for chest pain and leg swelling.  Endocrine: Negative for cold intolerance.  Musculoskeletal: Positive for arthralgias, gait problem and joint swelling.  Allergic/Immunologic: Negative for environmental allergies.  Neurological: Positive for headaches.   Past Medical History:  Diagnosis Date  . Asthma   . Gout   . Hyperinsulinemia   . Hypertension   . Migraine with aura   . Morbid obesity (HCC)     Past Surgical History:  Procedure Laterality Date  . TONSILLECTOMY      Current Outpatient Prescriptions on File Prior to Visit  Medication Sig Dispense Refill  . enalapril (VASOTEC) 10 MG tablet Take 1 tablet (10 mg total) by mouth at bedtime. 30 tablet 5  . hydrochlorothiazide (HYDRODIURIL) 25 MG tablet Take 1 tablet (25 mg total) by mouth daily. 30 tablet 2  . HYDROcodone-acetaminophen (NORCO/VICODIN) 5-325 MG tablet Take one tab po q 4 hrs prn pain 6 tablet 0  . HYDROcodone-acetaminophen (NORCO/VICODIN) 5-325 MG tablet Take one tab po q 4 hrs prn pain 12 tablet 0  . sertraline (ZOLOFT) 50 MG tablet Take  1 tablet (50 mg total) by mouth daily. (Patient not taking: Reported on 01/25/2017) 30 tablet 2  . traMADol (ULTRAM) 50 MG tablet Take 1 tablet (50 mg total) by mouth every 6 (six) hours as needed. (Patient not taking: Reported on 01/25/2017) 15 tablet 0   No current facility-administered medications on file prior to visit.     Social History   Social History  . Marital status: Single    Spouse name: N/A  . Number of children: N/A  . Years of education: N/A   Occupational History  . Not on file.   Social History Main Topics  . Smoking status: Never Smoker  . Smokeless tobacco: Never Used  . Alcohol use 0.0 oz/week     Comment: occasional  . Drug use: No  . Sexual activity: Not on file   Other Topics Concern  . Not on file   Social History Narrative  . No narrative on file    Family History  Problem Relation Age of Onset  . Hypertension Father   . Hypertension Maternal Grandfather   . Hypertension Paternal Grandmother     BP (!) 180/118   Pulse 88   Temp 97.6 F (36.4 C)   Ht 5\' 10"  (1.778 m)   Wt (!) 360 lb (163.3 kg)   BMI 51.65 kg/m      Objective:  Physical Exam  Constitutional: He is oriented to person, place, and time. He appears well-developed and well-nourished.  Morbid obesity   HENT:  Head: Normocephalic and atraumatic.  Eyes: Pupils are equal, round, and reactive to light. Conjunctivae and EOM are normal.  Neck: Normal range of motion. Neck supple.  Cardiovascular: Normal rate, regular rhythm and intact distal pulses.   Pulmonary/Chest: Effort normal.  Abdominal: Soft.  Musculoskeletal: He exhibits tenderness (Pain and swelling lateral ankle, limp left, ROM full, pain over anterior talofibular ligament, no redness, right ankle negative.).  Neurological: He is alert and oriented to person, place, and time. He has normal reflexes. No cranial nerve deficit. He exhibits normal muscle tone. Coordination normal.  Skin: Skin is warm and dry.    Psychiatric: He has a normal mood and affect. His behavior is normal. Judgment and thought content normal.  Vitals reviewed.         Assessment & Plan:   Encounter Diagnoses  Name Primary?  . Strain of left ankle, initial encounter Yes  . Morbid obesity (HCC)    I have given him a CAM walker with instructions.  He is to wear it at work.  If work will not let him stay because he is not in a steel toed shoe, he is to be out of work.  I have given instruction sheet on Contrast Baths.  He is to use Aspercreme to the ankle, continue the ibuprofen.  Return in two weeks.  Call if any problem.  Precautions discussed.   Electronically Signed Darreld McleanWayne Reaghan Kawa, MD 8/8/20189:44 AM

## 2017-01-25 NOTE — Patient Instructions (Signed)
Note for out of work today.  Note to wear CAM walker at work.

## 2017-01-26 MED FILL — Hydrocodone-Acetaminophen Tab 5-325 MG: ORAL | Qty: 6 | Status: AC

## 2017-02-08 ENCOUNTER — Ambulatory Visit (INDEPENDENT_AMBULATORY_CARE_PROVIDER_SITE_OTHER): Payer: BLUE CROSS/BLUE SHIELD | Admitting: Orthopaedic Surgery

## 2017-02-08 VITALS — BP 150/92 | HR 93 | Temp 97.7°F | Ht 70.0 in | Wt 358.0 lb

## 2017-02-08 DIAGNOSIS — S96912D Strain of unspecified muscle and tendon at ankle and foot level, left foot, subsequent encounter: Secondary | ICD-10-CM

## 2017-02-08 NOTE — Progress Notes (Signed)
Patient XM:IWOEHO Jason [name], male DOB:April 02, 1991, 26 y.o. ZYY:[id number]  Chief Complaint  Patient presents with  . Follow-up    Left ankle strain    HPI  Jason [name] is a 26 y.o. male who has left ankle pain.  It has not gotten any better using the CAM walker.  He is now six weeks since injury and still has pain and swelling of the lateral ankle.  I would like to get MRI to see if ankle ligament disruption. HPI  Body mass index is 51.37 kg/m.  ROS  Review of Systems  HENT: Negative for congestion.   Respiratory: Positive for shortness of breath. Negative for cough.   Cardiovascular: Negative for chest pain and leg swelling.  Endocrine: Negative for cold intolerance.  Musculoskeletal: Positive for arthralgias, gait problem and joint swelling.  Allergic/Immunologic: Negative for environmental allergies.  Neurological: Positive for headaches.    Past Medical History:  Diagnosis Date  . Asthma   . Gout   . Hyperinsulinemia   . Hypertension   . Migraine with aura   . Morbid obesity (HCC)     Past Surgical History:  Procedure Laterality Date  . TONSILLECTOMY      Family History  Problem Relation Age of Onset  . Hypertension Father   . Hypertension Maternal Grandfather   . Hypertension Paternal Grandmother     Social History Social History  Substance Use Topics  . Smoking status: Never Smoker  . Smokeless tobacco: Never Used  . Alcohol use 0.0 oz/week     Comment: occasional    Allergies  Allergen Reactions  . Codeine Nausea And Vomiting  . Adipex-P [Phentermine Hcl] Palpitations    Current Outpatient Prescriptions  Medication Sig Dispense Refill  . acetaminophen (TYLENOL) 325 MG tablet Take 650 mg by mouth every 6 (six) hours as needed.    . enalapril (VASOTEC) 10 MG tablet Take 1 tablet (10 mg total) by mouth at bedtime. 30 tablet 5  . hydrochlorothiazide (HYDRODIURIL) 25 MG tablet Take 1 tablet (25 mg total) by mouth daily. 30 tablet 2  .  HYDROcodone-acetaminophen (NORCO/VICODIN) 5-325 MG tablet Take one tab po q 4 hrs prn pain 6 tablet 0  . HYDROcodone-acetaminophen (NORCO/VICODIN) 5-325 MG tablet Take one tab po q 4 hrs prn pain 12 tablet 0  . sertraline (ZOLOFT) 50 MG tablet Take 1 tablet (50 mg total) by mouth daily. (Patient not taking: Reported on 01/25/2017) 30 tablet 2  . traMADol (ULTRAM) 50 MG tablet Take 1 tablet (50 mg total) by mouth every 6 (six) hours as needed. (Patient not taking: Reported on 01/25/2017) 15 tablet 0   No current facility-administered medications for this visit.      Physical Exam  Blood pressure (!) 150/92, pulse 93, temperature 97.7 F (36.5 C), height 5\' 10"  (1.778 m), weight (!) 358 lb (162.4 kg).  Constitutional: overall normal hygiene, normal nutrition, well developed, normal grooming, normal body habitus. Assistive device:CAM walker  Musculoskeletal: gait and station Limp left, muscle tone and strength are normal, no tremors or atrophy is present.  .  Neurological: coordination overall normal.  Deep tendon reflex/nerve stretch intact.  Sensation normal.  Cranial nerves II-XII intact.   Skin:   Normal overall no scars, lesions, ulcers or rashes. No psoriasis.  Psychiatric: Alert and oriented x 3.  Recent memory intact, remote memory unclear.  Normal mood and affect. Well groomed.  Good eye contact.  Cardiovascular: overall no swelling, no varicosities, no edema bilaterally, normal temperatures of the  legs and arms, no clubbing, cyanosis and good capillary refill.  Lymphatic: palpation is normal.  Left ankle has swelling and more pain laterally with pain over the anterior talofibular ligament.  NV intact. ROM full but tender.  Limp left.  The patient has been educated about the nature of the problem(s) and counseled on treatment options.  The patient appeared to understand what I have discussed and is in agreement with it.  Encounter Diagnoses  Name Primary?  . Strain of left ankle,  subsequent encounter Yes  . Morbid obesity (HCC)     PLAN Call if any problems.  Precautions discussed.  Continue current medications.   Return to clinic after MRI ankle left.   Electronically Signed Darreld Mclean, MD 8/22/20189:35 AM

## 2017-02-12 ENCOUNTER — Ambulatory Visit
Admission: RE | Admit: 2017-02-12 | Discharge: 2017-02-12 | Disposition: A | Discharge status: Home / Self Care | Payer: BLUE CROSS/BLUE SHIELD | Source: Ambulatory Visit | Attending: Orthopaedic Surgery | Admitting: Orthopaedic Surgery

## 2017-02-12 DIAGNOSIS — M65872 Other synovitis and tenosynovitis, left ankle and foot: Secondary | ICD-10-CM | POA: Diagnosis not present

## 2017-02-12 DIAGNOSIS — R600 Localized edema: Secondary | ICD-10-CM | POA: Diagnosis not present

## 2017-02-12 DIAGNOSIS — S96912D Strain of unspecified muscle and tendon at ankle and foot level, left foot, subsequent encounter: Secondary | ICD-10-CM

## 2017-02-14 ENCOUNTER — Ambulatory Visit (INDEPENDENT_AMBULATORY_CARE_PROVIDER_SITE_OTHER): Payer: BLUE CROSS/BLUE SHIELD | Admitting: Orthopaedic Surgery

## 2017-02-14 ENCOUNTER — Encounter: Payer: Self-pay | Admitting: Orthopaedic Surgery

## 2017-02-14 VITALS — BP 135/96 | HR 95 | Temp 97.5°F | Ht 70.0 in | Wt 358.0 lb

## 2017-02-14 DIAGNOSIS — S96912D Strain of unspecified muscle and tendon at ankle and foot level, left foot, subsequent encounter: Secondary | ICD-10-CM

## 2017-02-14 MED ORDER — PREDNISONE 5 MG (21) PO TBPK: ORAL_TABLET | ORAL | 0 refills | Status: DC

## 2017-02-14 NOTE — Progress Notes (Signed)
Jason [name], male DOB:05-Jul-1990, 26 y.o. [id number]  Chief Complaint  Jason presents with  . Follow-up    left ankle mri results    HPI  Jason [name] is a 26 y.o. male who continues to have left ankle pain.  He had MRI which showed: IMPRESSION: 1. 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 2. Moderate tenosynovitis of the flexor hallucis longus. 3. Muscle edema in the abductor hallucis muscle most consistent with muscle strain.  I have explained findings to him.  I will give prednisone dose pack.  Continue CAM walker. HPI  Body mass index is 51.37 kg/m.  ROS  Review of Systems  HENT: Negative for congestion.   Respiratory: Positive for shortness of breath. Negative for cough.   Cardiovascular: Negative for chest pain and leg swelling.  Endocrine: Negative for cold intolerance.  Musculoskeletal: Positive for arthralgias, gait problem and joint swelling.  Allergic/Immunologic: Negative for environmental allergies.  Neurological: Positive for headaches.    Past Medical History:  Diagnosis Date  . Asthma   . Gout   . Hyperinsulinemia   . Hypertension   . Migraine with aura   . Morbid obesity (HCC)     Past Surgical History:  Procedure Laterality Date  . TONSILLECTOMY      Family History  Problem Relation Age of Onset  . Hypertension Father   . Hypertension Maternal Grandfather   . Hypertension Paternal Grandmother     Social History Social History  Substance Use Topics  . Smoking status: Never Smoker  . Smokeless tobacco: Never Used  . Alcohol use 0.0 oz/week     Comment: occasional    Allergies  Allergen Reactions  . Codeine Nausea And Vomiting  . Adipex-P [Phentermine Hcl] Palpitations    Current Outpatient Prescriptions  Medication Sig Dispense Refill  . acetaminophen (TYLENOL) 325 MG tablet Take 650 mg by mouth every 6 (six)  hours as needed.    . enalapril (VASOTEC) 10 MG tablet Take 1 tablet (10 mg total) by mouth at bedtime. 30 tablet 5  . hydrochlorothiazide (HYDRODIURIL) 25 MG tablet Take 1 tablet (25 mg total) by mouth daily. 30 tablet 2  . HYDROcodone-acetaminophen (NORCO/VICODIN) 5-325 MG tablet Take one tab po q 4 hrs prn pain 6 tablet 0  . HYDROcodone-acetaminophen (NORCO/VICODIN) 5-325 MG tablet Take one tab po q 4 hrs prn pain 12 tablet 0  . predniSONE (STERAPRED UNI-PAK 21 TAB) 5 MG (21) TBPK tablet Take 6 pills first day; 5 pills second day; 4 pills third day; 3 pills fourth day; 2 pills next day and 1 pill last day. 21 tablet 0  . sertraline (ZOLOFT) 50 MG tablet Take 1 tablet (50 mg total) by mouth daily. (Jason not taking: Reported on 01/25/2017) 30 tablet 2  . traMADol (ULTRAM) 50 MG tablet Take 1 tablet (50 mg total) by mouth every 6 (six) hours as needed. (Jason not taking: Reported on 01/25/2017) 15 tablet 0   No current facility-administered medications for this visit.      Physical Exam  Blood pressure (!) 135/96, pulse 95, temperature (!) 97.5 F (36.4 C), height 5\' 10"  (1.778 m), weight (!) 358 lb (162.4 kg).  Constitutional: overall normal hygiene, normal nutrition, well developed, normal grooming, normal body habitus. Assistive device:CAM walker left  Musculoskeletal: gait and station Limp left, muscle tone and strength are normal, no tremors or atrophy is present.  Marland Kitchen  Neurological: coordination overall normal.  Deep tendon reflex/nerve stretch intact.  Sensation normal.  Cranial nerves II-XII intact.   Skin:   Normal overall no scars, lesions, ulcers or rashes. No psoriasis.  Psychiatric: Alert and oriented x 3.  Recent memory intact, remote memory unclear.  Normal mood and affect. Well groomed.  Good eye contact.  Cardiovascular: overall no swelling, no varicosities, no edema bilaterally, normal temperatures of the legs and arms, no clubbing, cyanosis and good capillary  refill.  Lymphatic: palpation is normal.  Left ankle has swelling laterally and some pain.  He has no redness.  ROM is full but tender.  Limp left.  NV intact.  The Jason has been educated about the nature of the problem(s) and counseled on treatment options.  The Jason appeared to understand what I have discussed and is in agreement with it.  Encounter Diagnoses  Name Primary?  . Strain of left ankle, subsequent encounter Yes  . Morbid obesity (HCC)     PLAN Call if any problems.  Precautions discussed.  Continue current medications.   Return to clinic 2 weeks   Electronically Signed Darreld Mclean, MD 8/28/201811:07 AM

## 2017-02-28 ENCOUNTER — Ambulatory Visit: Payer: Self-pay | Admitting: Orthopaedic Surgery

## 2017-10-10 DIAGNOSIS — M109 Gout, unspecified: Secondary | ICD-10-CM | POA: Diagnosis not present

## 2017-10-10 DIAGNOSIS — Z8669 Personal history of other diseases of the nervous system and sense organs: Secondary | ICD-10-CM | POA: Diagnosis not present

## 2017-10-10 DIAGNOSIS — R339 Retention of urine, unspecified: Secondary | ICD-10-CM | POA: Diagnosis not present

## 2017-10-10 DIAGNOSIS — I10 Essential (primary) hypertension: Secondary | ICD-10-CM | POA: Diagnosis not present

## 2017-10-13 DIAGNOSIS — Z0000 Encounter for general adult medical examination without abnormal findings: Secondary | ICD-10-CM | POA: Diagnosis not present

## 2017-10-13 DIAGNOSIS — I10 Essential (primary) hypertension: Secondary | ICD-10-CM | POA: Diagnosis not present

## 2017-10-17 DIAGNOSIS — I10 Essential (primary) hypertension: Secondary | ICD-10-CM | POA: Diagnosis not present

## 2017-10-17 DIAGNOSIS — Z0000 Encounter for general adult medical examination without abnormal findings: Secondary | ICD-10-CM | POA: Diagnosis not present

## 2017-10-17 DIAGNOSIS — R339 Retention of urine, unspecified: Secondary | ICD-10-CM | POA: Diagnosis not present

## 2017-11-01 DIAGNOSIS — M109 Gout, unspecified: Secondary | ICD-10-CM | POA: Diagnosis not present

## 2017-11-01 DIAGNOSIS — R339 Retention of urine, unspecified: Secondary | ICD-10-CM | POA: Diagnosis not present

## 2017-11-01 DIAGNOSIS — I10 Essential (primary) hypertension: Secondary | ICD-10-CM | POA: Diagnosis not present

## 2018-01-15 ENCOUNTER — Encounter (HOSPITAL_COMMUNITY): Payer: Self-pay | Admitting: *Deleted

## 2018-01-15 ENCOUNTER — Other Ambulatory Visit: Payer: Self-pay

## 2018-01-15 ENCOUNTER — Emergency Department (HOSPITAL_COMMUNITY): Payer: BLUE CROSS/BLUE SHIELD

## 2018-01-15 ENCOUNTER — Emergency Department (HOSPITAL_COMMUNITY)
Admission: EM | Admit: 2018-01-15 | Discharge: 2018-01-15 | Disposition: A | Discharge status: Home / Self Care | Payer: BLUE CROSS/BLUE SHIELD | Attending: Emergency Medicine | Admitting: Emergency Medicine

## 2018-01-15 DIAGNOSIS — I10 Essential (primary) hypertension: Secondary | ICD-10-CM | POA: Insufficient documentation

## 2018-01-15 DIAGNOSIS — M25561 Pain in right knee: Secondary | ICD-10-CM | POA: Diagnosis not present

## 2018-01-15 DIAGNOSIS — J45909 Unspecified asthma, uncomplicated: Secondary | ICD-10-CM | POA: Diagnosis not present

## 2018-01-15 DIAGNOSIS — Z79899 Other long term (current) drug therapy: Secondary | ICD-10-CM | POA: Diagnosis not present

## 2018-01-15 MED ORDER — NAPROXEN 500 MG PO TABS: 500.0000 mg | ORAL_TABLET | Freq: Two times a day (BID) | ORAL | 0 refills | Status: DC

## 2018-01-15 MED ORDER — OXYCODONE-ACETAMINOPHEN 5-325 MG PO TABS
1.0000 | ORAL_TABLET | Freq: Once | ORAL | Status: AC
  Administered 2018-01-15: 1 via ORAL
  Filled 2018-01-15: qty 1

## 2018-01-15 MED ORDER — TRAMADOL HCL 50 MG PO TABS: 50.0000 mg | ORAL_TABLET | Freq: Four times a day (QID) | ORAL | 0 refills | Status: DC | PRN

## 2018-01-15 MED ORDER — KETOROLAC TROMETHAMINE 60 MG/2ML IM SOLN
60.0000 mg | Freq: Once | INTRAMUSCULAR | Status: AC
  Administered 2018-01-15: 60 mg via INTRAMUSCULAR
  Filled 2018-01-15: qty 2

## 2018-01-15 NOTE — Discharge Instructions (Addendum)
Usual crutches as needed for weightbearing and support.  Follow-up with your primary doctor for recheck in a few days if not improving.

## 2018-01-15 NOTE — ED Triage Notes (Signed)
Pt c/o right knee pain that started this am when waking up, denies any injury,

## 2018-01-16 NOTE — ED Provider Notes (Signed)
[facility] EMERGENCY DEPARTMENT Provider Note   CSN: [id number] Arrival date & time: 01/15/18  1848     History   Chief Complaint Chief Complaint  Patient presents with  . Knee Pain    HPI Jason [name] is a 27 y.o. male.  HPI   Jason SmokerWesley R [name] is a 27 y.o. male who presents to the Emergency Department complaining of right knee pain upon waking.  He describes a sharp, stabbing pain to the upper knee that is constant, but worse with bending or weight bearing.  He denies known injury.  No redness or swelling.  No history of previous issues with the affected knee.  He has not tried any medications or therapies for symptom relief.  Pain does not radiate.      Past Medical History:  Diagnosis Date  . Asthma   . Gout   . Hyperinsulinemia   . Hypertension   . Migraine with aura   . Morbid obesity [facility](HCC)     Patient Active Problem List   Diagnosis Date Noted  . Obsessive thinking 11/19/2016  . Hypertension 07/22/2016  . Hyperinsulinemia 10/30/2012  . Depression with anxiety 10/30/2012  . Morbid obesity (HCC) 10/30/2012    Past Surgical History:  Procedure Laterality Date  . TONSILLECTOMY        Home Medications    Prior to Admission medications   Medication Sig Start Date End Date Taking? Authorizing Provider  acetaminophen (TYLENOL) 325 MG tablet Take 650 mg by mouth every 6 (six) hours as needed.    [provider]  enalapril (VASOTEC) 10 MG tablet Take 1 tablet (10 mg total) by mouth at bedtime. 07/22/16   Merlyn AlbertLuking, William S, MD  hydrochlorothiazide (HYDRODIURIL) 25 MG tablet Take 1 tablet (25 mg total) by mouth daily. 11/18/16   Campbell RichesHoskins, Carolyn C, NP  HYDROcodone-acetaminophen (NORCO/VICODIN) 5-325 MG tablet Take one tab po q 4 hrs prn pain 01/19/17   Elizabeth Paulsen, PA-C  HYDROcodone-acetaminophen (NORCO/VICODIN) 5-325 MG tablet Take one tab po q 4 hrs prn pain 01/19/17   Roy Snuffer, PA-C  naproxen (NAPROSYN) 500 MG tablet Take 1 tablet (500 mg  total) by mouth 2 (two) times daily with a meal. 01/15/18   Brisha Mccabe, PA-C  predniSONE (STERAPRED UNI-PAK 21 TAB) 5 MG (21) TBPK tablet Take 6 pills first day; 5 pills second day; 4 pills third day; 3 pills fourth day; 2 pills next day and 1 pill last day. 02/14/17   Darreld McleanKeeling, Wayne, MD  sertraline (ZOLOFT) 50 MG tablet Take 1 tablet (50 mg total) by mouth daily. Patient not taking: Reported on 01/25/2017 11/18/16 11/18/17  Campbell RichesHoskins, Carolyn C, NP  traMADol (ULTRAM) 50 MG tablet Take 1 tablet (50 mg total) by mouth every 6 (six) hours as needed. 01/15/18   Pauline Ausriplett, Elias Dennington, PA-C    Family History Family History  Problem Relation Age of Onset  . Hypertension Father   . Hypertension Maternal Grandfather   . Hypertension Paternal Grandmother     Social History Social History   Tobacco Use  . Smoking status: Never Smoker  . Smokeless tobacco: Never Used  Substance Use Topics  . Alcohol use: Yes    Alcohol/week: 0.0 oz    Comment: occasional  . Drug use: No     Allergies   Codeine and Adipex-p [phentermine hcl]   Review of Systems Review of Systems  Constitutional: Negative for chills and fever.  Gastrointestinal: Negative for nausea and vomiting.  Musculoskeletal: Positive for arthralgias (right  knee pain). Negative for joint swelling.  Skin: Negative for color change and wound.  Neurological: Negative for weakness and numbness.  All other systems reviewed and are negative.    Physical Exam Updated Vital Signs BP (!) 146/80 (BP Location: Right Arm)   Pulse 85   Temp 98 F (36.7 C) (Oral)   Resp 17   Ht 6' (1.829 m)   Wt (!) 152 kg (335 lb)   SpO2 99%   BMI 45.43 kg/m   Physical Exam  Constitutional: He appears well-developed and well-nourished. No distress.  Cardiovascular: Normal rate, regular rhythm and intact distal pulses.  Pulmonary/Chest: Effort normal and breath sounds normal.  Musculoskeletal: He exhibits tenderness. He exhibits no edema or deformity.  ttp  of the along the patella of right knee.  No edema, erythema, effusion, or step-off deformity.  No pain with valgus or varus stresses, neg drawer sign.   Neurological: He is alert. No sensory deficit.  Skin: Skin is warm and dry. Capillary refill takes less than 2 seconds. No erythema.  Nursing note and vitals reviewed.    ED Treatments / Results  Labs (all labs ordered are listed, but only abnormal results are displayed) Labs Reviewed - No data to display  EKG None  Radiology Dg Knee Complete 4 Views Right  Result Date: 01/15/2018 CLINICAL DATA:  Patient with morbid obesity and RIGHT knee pain. EXAM: RIGHT KNEE - COMPLETE 4+ VIEW COMPARISON:  None. FINDINGS: No evidence of fracture, dislocation, or joint effusion. No evidence of arthropathy or other focal bone abnormality. Soft tissues are unremarkable. IMPRESSION: Negative. Electronically Signed   By: Elsie Stain M.D.   On: 01/15/2018 19:38    Procedures Procedures (including critical care time)  Medications Ordered in ED Medications  ketorolac (TORADOL) injection 60 mg (60 mg Intramuscular Given 01/15/18 2118)  oxyCODONE-acetaminophen (PERCOCET/ROXICET) 5-325 MG per tablet 1 tablet (1 tablet Oral Given 01/15/18 2117)     Initial Impression / Assessment and Plan / ED Course  I have reviewed the triage vital signs and the nursing notes.  Pertinent labs & imaging results that were available during my care of the patient were reviewed by me and considered in my medical decision making (see chart for details).     Pt with several hours of right knee pain w/o known injury.  No concerning sx's for septic joint on exam.  No obvious ligamentous instability.  NV intact  Knee sleeve applied for support with wear instructions.  Pt agrees to RICE therapy and close orthopedic f/u if not improving.  Return precautions discussed.   Final Clinical Impressions(s) / ED Diagnoses   Final diagnoses:  Acute pain of right knee    ED  Discharge Orders        Ordered    traMADol (ULTRAM) 50 MG tablet  Every 6 hours PRN     01/15/18 2107    naproxen (NAPROSYN) 500 MG tablet  2 times daily with meals     01/15/18 2107       Pauline Aus, PA-C 01/16/18 1629    Linwood Dibbles, MD 01/16/18 2224

## 2018-03-30 DIAGNOSIS — I10 Essential (primary) hypertension: Secondary | ICD-10-CM | POA: Diagnosis not present

## 2019-03-26 DIAGNOSIS — N503 Cyst of epididymis: Secondary | ICD-10-CM | POA: Diagnosis not present

## 2019-04-03 DIAGNOSIS — N503 Cyst of epididymis: Secondary | ICD-10-CM | POA: Diagnosis not present

## 2019-04-03 DIAGNOSIS — N50811 Right testicular pain: Secondary | ICD-10-CM | POA: Diagnosis not present

## 2019-05-18 IMAGING — DX DG ANKLE COMPLETE 3+V*L*
3 series · 3 of 3 positions shown · non-contrast
Comparison: Left ankle radiograph dated 12/17/2014

CLINICAL DATA: 25-year-old male with injury to the left ankle.

EXAM:
LEFT ANKLE COMPLETE - 3+ VIEW

[ankle ap]
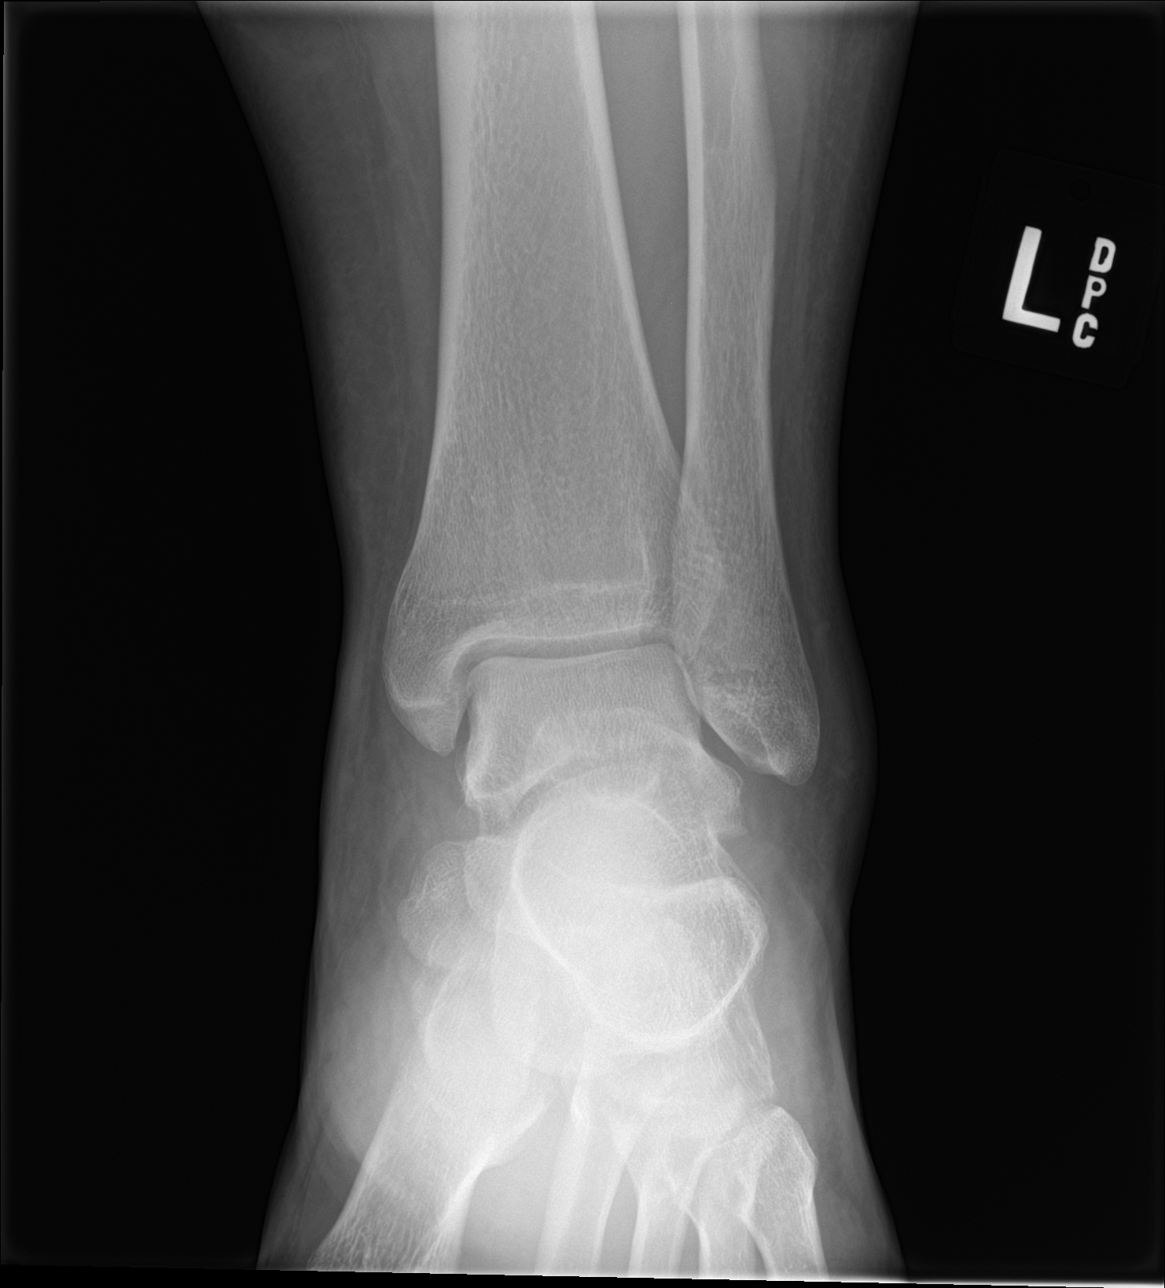

[ankle obl]
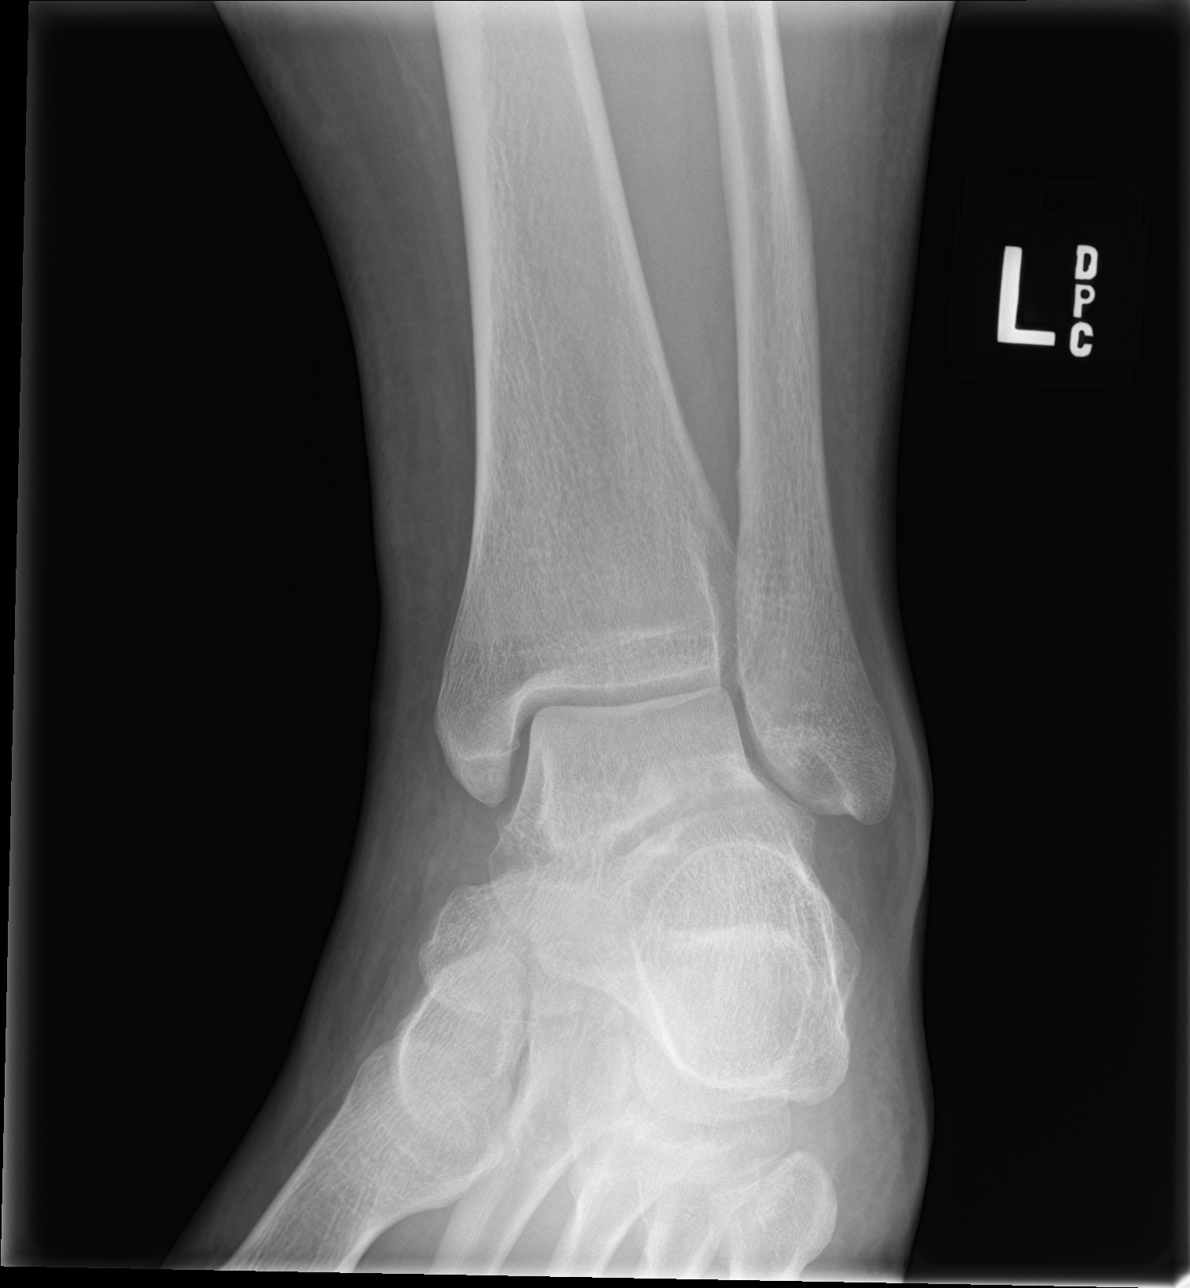

[ankle lat]
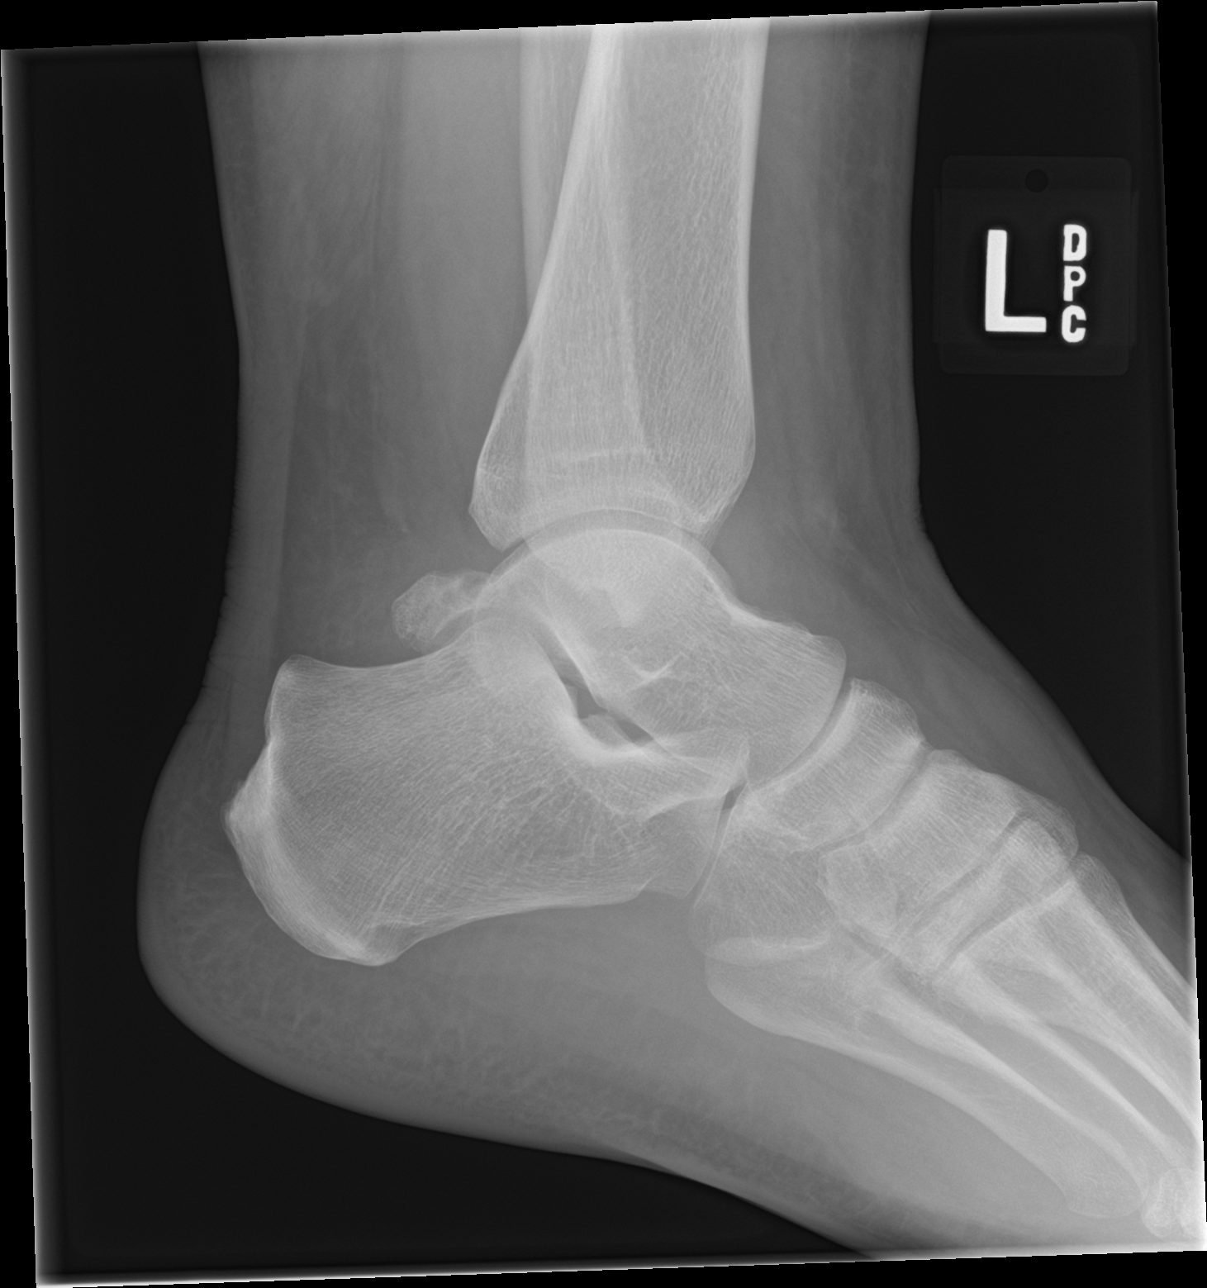

[3 of 3 positions shown; findings below may reference images not displayed]

FINDINGS: There is no acute fracture or dislocation. The bones are well
mineralized. No arthritic changes. There is mild diffuse
subcutaneous edema.
IMPRESSION: No acute/traumatic osseous pathology.

## 2019-06-18 ENCOUNTER — Ambulatory Visit: Payer: BC Managed Care – PPO | Attending: Internal Medicine

## 2019-06-18 ENCOUNTER — Other Ambulatory Visit: Payer: Self-pay

## 2019-06-18 DIAGNOSIS — Z20822 Contact with and (suspected) exposure to covid-19: Secondary | ICD-10-CM

## 2019-06-18 DIAGNOSIS — Z20828 Contact with and (suspected) exposure to other viral communicable diseases: Secondary | ICD-10-CM | POA: Insufficient documentation

## 2019-06-19 LAB — NOVEL CORONAVIRUS, NAA: SARS-CoV-2, NAA: NOT DETECTED

## 2019-07-26 ENCOUNTER — Ambulatory Visit (INDEPENDENT_AMBULATORY_CARE_PROVIDER_SITE_OTHER): Payer: BC Managed Care – PPO | Admitting: Adult Health Nurse Practitioner

## 2019-07-26 ENCOUNTER — Other Ambulatory Visit: Payer: Self-pay

## 2019-07-26 ENCOUNTER — Encounter: Payer: Self-pay | Admitting: Adult Health Nurse Practitioner

## 2019-07-26 VITALS — BP 146/95 | HR 103 | Temp 97.9°F | Ht 72.0 in | Wt 386.6 lb

## 2019-07-26 DIAGNOSIS — I10 Essential (primary) hypertension: Secondary | ICD-10-CM | POA: Diagnosis not present

## 2019-07-26 DIAGNOSIS — G4709 Other insomnia: Secondary | ICD-10-CM

## 2019-07-26 DIAGNOSIS — F419 Anxiety disorder, unspecified: Secondary | ICD-10-CM

## 2019-07-26 DIAGNOSIS — F329 Major depressive disorder, single episode, unspecified: Secondary | ICD-10-CM | POA: Diagnosis not present

## 2019-07-26 DIAGNOSIS — Z6841 Body Mass Index (BMI) 40.0 and over, adult: Secondary | ICD-10-CM | POA: Diagnosis not present

## 2019-07-26 DIAGNOSIS — F32A Depression, unspecified: Secondary | ICD-10-CM

## 2019-07-26 MED ORDER — PAROXETINE HCL 30 MG PO TABS: 30.0000 mg | ORAL_TABLET | Freq: Every day | ORAL | 3 refills | Status: DC

## 2019-07-26 MED ORDER — LISINOPRIL-HYDROCHLOROTHIAZIDE 20-25 MG PO TABS: 1.0000 | ORAL_TABLET | Freq: Every day | ORAL | 3 refills | Status: DC

## 2019-07-26 MED ORDER — QUETIAPINE FUMARATE 25 MG PO TABS: 25.0000 mg | ORAL_TABLET | Freq: Every day | ORAL | 1 refills | Status: DC

## 2019-07-26 MED ORDER — CLONAZEPAM 0.5 MG PO TABS: ORAL_TABLET | ORAL | 1 refills | Status: DC

## 2019-07-26 NOTE — Patient Instructions (Signed)
° ° ° °  If you have lab work done today you will be contacted with your lab results within the next 2 weeks.  If you have not heard from us then please contact us. The fastest way to get your results is to register for My Chart. ° ° °IF you received an x-ray today, you will receive an invoice from [facility]. Please contact [facility] at [phone] with questions or concerns regarding your invoice.  ° °IF you received labwork today, you will receive an invoice from [facility]. Please contact [facility] at [phone] with questions or concerns regarding your invoice.  ° °Our billing staff will not be able to assist you with questions regarding bills from these companies. ° °You will be contacted with the lab results as soon as they are available. The fastest way to get your results is to activate your My Chart account. Instructions are located on the last page of this paperwork. If you have not heard from us regarding the results in 2 weeks, please contact this office. °  ° ° ° °

## 2019-07-26 NOTE — Progress Notes (Signed)
Chief Complaint  Patient presents with  . Establish Care    Pt stated that high BP runs heavy in Jason [name] family along with anxiety/depression so Jason [name] would like to get these things under controll.  . Depression    22  . Anxiety    17    HPI patient presents with a long-term history of anxiety and depression.  Jason [name] reports a very difficult time growing up in Jason [name] family.  Jason [name] had alcoholism and bipolar.  Jason [name] has depression.  Jason [name] is a strong family history of bipolar and substance abuse on the top paternal side and on maternal side strong family history of depression.  Jason [name] has struggled with social situations, isolation, and overeating to cope with Jason [name] anxiety and depression.  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  Jason [name] has a 47-year-old son now and is in discussion with Jason [name] asked to reconcile.  Jason [name] has new motivation for starting to get Jason [name] health in order so that Jason [name] can play with Jason [name] son and to prevent further physical and mental complications later on.  States that Jason [name] has been a big eye for most of Jason [name] life but after Jason [name] stopped playing football and doing other sports related activities, Jason [name] has consistently gained weight.  Finds that eating will help him sleep.  Jason [name] difficulty with insomnia getting anywhere from 2 to 4 hours of sleep at night.  Jason [name] has been told that Jason [name] snores.  When Jason [name] is depressed, states Jason [name] does not want to go out and in social situations make him very nervous.  Jason [name] had to leave a niece's birthday party due to the number of people around.  Jason [name] has many things Jason [name] wants to do but finds Jason [name] can get out the door to do them necessarily.  In the past she has been treated with Zoloft and Wellbutrin.  Jason [name] does not really remember their effectiveness.  Jason [name] denies any suicide plan or intention.  Previously was on enalapril for blood pressure control but  has not been on it in at least 5 years.  Jason [name] notices that Jason [name] blood pressure will spike and Jason [name] will get some chest pain when Jason [name] is very anxious or angry.  Jason [name] is interested in getting Jason [name] blood pressure under control as well as Jason [name] weight.  Problem List    Problem List: 2018-06: Obsessive thinking 2018-02: Hypertension 2014-05: Hyperinsulinemia 2014-05: Depression with anxiety 2014-05: Morbid obesity (HCC)   Allergies   is allergic to codeine and adipex-p [phentermine hcl].  Medications    Current Outpatient Medications:  .  acetaminophen (TYLENOL) 325 MG tablet, Take 650 mg by mouth every 6 (six) hours as needed., Disp: , Rfl:  .  enalapril (VASOTEC) 10 MG tablet, Take 1 tablet (10 mg total) by mouth at bedtime. (Patient not taking: Reported on 07/26/2019), Disp: 30 tablet, Rfl: 5 .  hydrochlorothiazide (HYDRODIURIL) 25 MG tablet, Take 1 tablet (25 mg total) by mouth daily. (Patient not taking: Reported on 07/26/2019), Disp: 30 tablet, Rfl: 2 .  HYDROcodone-acetaminophen (NORCO/VICODIN) 5-325 MG tablet, Take one tab po q 4 hrs prn pain (Patient not taking: Reported on 07/26/2019), Disp: 6 tablet, Rfl: 0 .  HYDROcodone-acetaminophen (NORCO/VICODIN) 5-325 MG tablet, Take one tab po q 4 hrs prn pain (Patient not taking: Reported on 07/26/2019), Disp: 12 tablet, Rfl: 0 .  naproxen (NAPROSYN) 500 MG  tablet, Take 1 tablet (500 mg total) by mouth 2 (two) times daily with a meal. (Patient not taking: Reported on 07/26/2019), Disp: 14 tablet, Rfl: 0 .  predniSONE (STERAPRED UNI-PAK 21 TAB) 5 MG (21) TBPK tablet, Take 6 pills first day; 5 pills second day; 4 pills third day; 3 pills fourth day; 2 pills next day and 1 pill last day. (Patient not taking: Reported on 07/26/2019), Disp: 21 tablet, Rfl: 0 .  sertraline (ZOLOFT) 50 MG tablet, Take 1 tablet (50 mg total) by mouth daily. (Patient not taking: Reported on 01/25/2017), Disp: 30 tablet, Rfl: 2 .  traMADol (ULTRAM) 50 MG tablet, Take 1 tablet (50 mg total) by  mouth every 6 (six) hours as needed. (Patient not taking: Reported on 07/26/2019), Disp: 8 tablet, Rfl: 0    GAD-7/PHQ-9   GAD 7 : Generalized Anxiety Score 07/26/2019  Nervous, Anxious, on Edge 2  Control/stop worrying 3  Worry too much - different things 3  Trouble relaxing 2  Restless 2  Easily annoyed or irritable 2  Afraid - awful might happen 3  Total GAD 7 Score 17  Anxiety Difficulty Not difficult at all      Office Visit from 07/26/2019 in Primary Care at [facility]  PHQ-9 Total Score  20      Review of Systems    Constitutional: Negative for activity change, appetite change, chills and fever.  HENT: Negative for congestion, nosebleeds, trouble swallowing and voice change.   Respiratory: Negative for cough, shortness of breath and wheezing.   Cardiac:  Negative for chest pain, pressure, syncope  Gastrointestinal: Negative for diarrhea, nausea and vomiting.  Genitourinary: Negative for difficulty urinating, dysuria, flank pain and hematuria.  Musculoskeletal: Negative for back pain, joint swelling and neck pain.  Neurological: Negative for dizziness, speech difficulty, light-headedness and numbness.  Psychological ROS: positive for - anxiety, depression, obsessive thoughts and sleep disturbances negative for - behavioral disorder, hostility or suicidal ideation  See HPI. All other review of systems negative.     Physical Exam:    height is 6' (1.829 m) and weight is 386 lb 9.6 oz (175.4 kg) (abnormal). Jason [name] temporal temperature is 97.9 F (36.6 C). Jason [name] blood pressure is 146/95 (abnormal) and Jason [name] pulse is 103 (abnormal). Jason [name] oxygen saturation is 93%.   Physical Examination: General appearance - alert, well appearing, and in no distress and oriented to person, place, and time Mental status - normal mood, behavior, speech, dress, motor activity, and thought processes Eyes - PERRL. Extraocular movements intact.  No nystagmus.  Neck - supple, no significant adenopathy, carotids  upstroke normal bilaterally, no bruits, thyroid exam: thyroid is normal in size without nodules or tenderness Chest - clear to auscultation, no wheezes, rales or rhonchi, symmetric air entry  Heart - normal rate, regular rhythm, normal S1, S2, no murmurs, rubs, clicks or gallops Extremities - dependent LE edema without clubbing or cyanosis Skin - normal coloration and turgor, no rashes, no suspicious skin lesions noted  No hyperpigmentation of skin.  No current hematomas noted   Lab /Imaging Review    orders written for new lab studies as appropriate; see orders, no lab studies available for review at time of visit.   Assessment & Plan:  Jason [name] is a 29 y.o. male who is being seen primarily for anxiety/depression, and obesity.  We discussed all medications in detail. I've explained to him that drugs of the SSRI class can have side effects such as weight gain, sexual dysfunction,  insomnia, headache, nausea. These medications are generally effective at alleviating symptoms of anxiety and/or depression. Let me know if significant side effects do occur.  Will start combined ACE/diuretic for HTN.  Patient unable to find cuff big enough to monitor.  Will recheck in 1 month.   Patient was congratulated on taking the initiative to improve both Jason [name] mental and physical health knowing it is important for Jason [name]     1. Anxiety and depression   2. Other insomnia   3. Class 3 severe obesity due to excess calories without serious comorbidity with body mass index (BMI) of 50.0 to 59.9 in adult ([place])   4. Essential hypertension    Meds ordered this encounter  Medications  . PARoxetine (PAXIL) 30 MG tablet    Sig: Take 1 tablet (30 mg total) by mouth daily.    Dispense:  30 tablet    Refill:  3  . clonazePAM (KLONOPIN) 0.5 MG tablet    Sig: 1/2 to 1 tab q 12 hours prn for anxiety    Dispense:  30 tablet    Refill:  1  . QUEtiapine (SEROQUEL) 25 MG tablet    Sig: Take 1 tablet (25 mg total)  by mouth at bedtime.    Dispense:  30 tablet    Refill:  1  . lisinopril-hydrochlorothiazide (ZESTORETIC) 20-25 MG tablet    Sig: Take 1 tablet by mouth daily.    Dispense:  30 tablet    Refill:  3   Orders Placed This Encounter  Procedures  . CMP14+EGFR  . Thyroid Panel With TSH  . Ambulatory referral to Psychology  . Amb Ref to Medical Weight Management    All questions were answered and patient is inline with this plan.   Glyn Ade, NP

## 2019-07-27 LAB — CMP14+EGFR
ALT: 44 IU/L (ref 0–44)
AST: 26 IU/L (ref 0–40)
Albumin/Globulin Ratio: 1.6 (ref 1.2–2.2)
Albumin: 4.5 g/dL (ref 4.1–5.2)
Alkaline Phosphatase: 104 IU/L (ref 39–117)
BUN/Creatinine Ratio: 12 (ref 9–20)
BUN: 12 mg/dL (ref 6–20)
Bilirubin Total: 0.9 mg/dL (ref 0.0–1.2)
CO2: 21 mmol/L (ref 20–29)
Calcium: 9.3 mg/dL (ref 8.7–10.2)
Chloride: 104 mmol/L (ref 96–106)
Creatinine, Ser: 1.03 mg/dL (ref 0.76–1.27)
GFR calc Af Amer: 115 mL/min/{1.73_m2} (ref >59)
GFR calc non Af Amer: 100 mL/min/{1.73_m2} (ref >59)
Globulin, Total: 2.9 g/dL (ref 1.5–4.5)
Glucose: 93 mg/dL (ref 65–99)
Potassium: 4.3 mmol/L (ref 3.5–5.2)
Sodium: 139 mmol/L (ref 134–144)
Total Protein: 7.4 g/dL (ref 6.0–8.5)

## 2019-07-27 LAB — THYROID PANEL WITH TSH
Free Thyroxine Index: 1.8 (ref 1.2–4.9)
T3 Uptake Ratio: 23 % — ABNORMAL LOW (ref 24–39)
T4, Total: 7.7 ug/dL (ref 4.5–12.0)
TSH: 1.76 u[IU]/mL (ref 0.450–4.500)

## 2019-08-23 ENCOUNTER — Other Ambulatory Visit: Payer: Self-pay

## 2019-08-23 ENCOUNTER — Ambulatory Visit (INDEPENDENT_AMBULATORY_CARE_PROVIDER_SITE_OTHER): Payer: BC Managed Care – PPO | Admitting: Adult Health Nurse Practitioner

## 2019-08-23 ENCOUNTER — Encounter: Payer: Self-pay | Admitting: Adult Health Nurse Practitioner

## 2019-08-23 VITALS — BP 141/84 | HR 78 | Temp 97.8°F | Ht 72.0 in | Wt 391.0 lb

## 2019-08-23 DIAGNOSIS — F329 Major depressive disorder, single episode, unspecified: Secondary | ICD-10-CM

## 2019-08-23 DIAGNOSIS — F419 Anxiety disorder, unspecified: Secondary | ICD-10-CM | POA: Diagnosis not present

## 2019-08-23 DIAGNOSIS — F32A Depression, unspecified: Secondary | ICD-10-CM

## 2019-08-23 DIAGNOSIS — I10 Essential (primary) hypertension: Secondary | ICD-10-CM | POA: Diagnosis not present

## 2019-08-23 DIAGNOSIS — Z23 Encounter for immunization: Secondary | ICD-10-CM

## 2019-08-23 MED ORDER — LOSARTAN POTASSIUM 50 MG PO TABS: 50.0000 mg | ORAL_TABLET | Freq: Every day | ORAL | 3 refills | Status: DC

## 2019-08-23 MED ORDER — VENLAFAXINE HCL ER 75 MG PO CP24: ORAL_CAPSULE | ORAL | 1 refills | Status: DC

## 2019-08-23 MED ORDER — PHENTERMINE HCL 37.5 MG PO CAPS: 37.5000 mg | ORAL_CAPSULE | ORAL | 1 refills | Status: DC

## 2019-08-23 NOTE — Patient Instructions (Addendum)

## 2019-08-23 NOTE — Progress Notes (Signed)
08/23/2019  Jason [name] 07-23-1990 [id number]    Jason [name] is a 29 y.o. male who presents for follow up of anxiety disorder. Current symptoms: fatigue, irritable. He denies current suicidal and homicidal ideation. He complains of the following side effects from the treatment: drowsiness, dry mouth and irritability and fatigue .  Hypertension: Patient here for follow-up of elevated blood pressure. He is not exercising and is adherent to low salt diet.  Blood pressure is unknown whether  well controlled at home. Cardiac symptoms fatigue. Patient denies chest pressure/discomfort and lower extremity edema.  Cardiovascular risk factors: family history of premature cardiovascular disease, hypertension, male gender, obesity (BMI >= 30 kg/m2) and sedentary lifestyle. Use of agents associated with hypertension: none. History of target organ damage: none.  Obesity/Weight Loss    The following portions of the patient's history were reviewed and updated as appropriate:  He  has a past medical history of Anxiety, Asthma, Depression, Gout, Hyperinsulinemia, Hypertension, Migraine with aura, and Morbid obesity (HCC). He does not have any pertinent problems on file. His family history includes Hypertension in his brother, father, maternal grandfather, and paternal grandmother. He  reports that he has never smoked. He has never used smokeless tobacco. He reports current alcohol use. He reports that he does not use drugs. Current Outpatient Medications on File Prior to Visit  Medication Sig Dispense Refill  . clonazePAM (KLONOPIN) 0.5 MG tablet 1/2 to 1 tab q 12 hours prn for anxiety 30 tablet 1  . lisinopril-hydrochlorothiazide (ZESTORETIC) 20-25 MG tablet Take 1 tablet by mouth daily. 30 tablet 3  . QUEtiapine (SEROQUEL) 25 MG tablet Take 1 tablet (25 mg total) by mouth at bedtime. 30 tablet 1  . acetaminophen (TYLENOL) 325 MG tablet Take 650 mg by mouth every 6 (six) hours as needed.    .  naproxen (NAPROSYN) 500 MG tablet Take 1 tablet (500 mg total) by mouth 2 (two) times daily with a meal. (Patient not taking: Reported on 07/26/2019) 14 tablet 0  . traMADol (ULTRAM) 50 MG tablet Take 1 tablet (50 mg total) by mouth every 6 (six) hours as needed. (Patient not taking: Reported on 07/26/2019) 8 tablet 0   No current facility-administered medications on file prior to visit.   He is allergic to codeine..    Objective:    BP (!) 141/84 (BP Location: Right Arm, Patient Position: Sitting, Cuff Size: Large)   Pulse 78   Temp 97.8 F (36.6 C) (Temporal)   Ht 6' (1.829 m)   Wt (!) 391 lb (177.4 kg)   SpO2 95%   BMI 53.03 kg/m   General appearance: alert, well appearing, and in no distress, oriented to person, place, and time and anxious. Chest: clear to auscultation, no wheezes, rales or rhonchi, symmetric air entry.  CVS exam: normal rate, regular rhythm, normal S1, S2, no murmurs, rubs, clicks or gallops. Skin exam - normal coloration and turgor, no rashes, no suspicious skin lesions noted. Mental Status: normal mood, behavior, speech, dress, motor activity, and thought processes, anxious.   GAD-7, PHQ-o   GAD 7 : Generalized Anxiety Score 08/23/2019 07/26/2019  Nervous, Anxious, on Edge 2 2  Control/stop worrying 3 3  Worry too much - different things 3 3  Trouble relaxing 2 2  Restless 2 2  Easily annoyed or irritable 2 2  Afraid - awful might happen 3 3  Total GAD 7 Score 17 17  Anxiety Difficulty - Not difficult at all   [facility]  SCORE ONLY 08/23/2019 07/26/2019  Score 18 20      Assessment:   1. Need for prophylactic vaccination with combined diphtheria-tetanus-pertussis (DTP) vaccine   2. Morbid obesity ([place])   3. Anxiety and depression   4. Essential hypertension       Plan:    Medications: Due to fatigue, change Paxil to Effexor.. Instructed patient to contact office or on-call physician promptly should condition worsen or any new symptoms appear and provided  on-call telephone numbers. IF THE PATIENT HAS ANY SUICIDAL OR HOMICIDAL IDEATIONS, CALL THE OFFICE, DISCUSS WITH A SUPPORT MEMBER, OR GO TO THE ER IMMEDIATELY. Patient was agreeable with this plan. Follow up: 4 weeks. Spent 25 minutes (>50% of visit) discussing the risks of anxiety disorder and sleep disturbance, the  pathophysiology, etiology, risks, and principles of treatment.     Meds ordered this encounter  Medications  . DISCONTD: losartan (COZAAR) 50 MG tablet    Sig: Take 1 tablet (50 mg total) by mouth daily.    Dispense:  90 tablet    Refill:  3  . venlafaxine XR (EFFEXOR-XR) 75 MG 24 hr capsule    Sig: 1 tab for 7 days, then increase to 2 tabs daily    Dispense:  60 capsule    Refill:  1  . phentermine 37.5 MG capsule    Sig: Take 1 capsule (37.5 mg total) by mouth every morning.    Dispense:  30 capsule    Refill:  [place], NP

## 2019-09-27 ENCOUNTER — Ambulatory Visit (INDEPENDENT_AMBULATORY_CARE_PROVIDER_SITE_OTHER): Payer: BC Managed Care – PPO | Admitting: Adult Health Nurse Practitioner

## 2019-09-27 ENCOUNTER — Other Ambulatory Visit: Payer: Self-pay

## 2019-09-27 ENCOUNTER — Encounter: Payer: Self-pay | Admitting: Adult Health Nurse Practitioner

## 2019-09-27 VITALS — BP 137/90 | HR 103 | Temp 97.9°F | Ht 72.0 in | Wt 377.0 lb

## 2019-09-27 DIAGNOSIS — I10 Essential (primary) hypertension: Secondary | ICD-10-CM

## 2019-09-27 DIAGNOSIS — F428 Other obsessive-compulsive disorder: Secondary | ICD-10-CM

## 2019-09-27 DIAGNOSIS — Z6841 Body Mass Index (BMI) 40.0 and over, adult: Secondary | ICD-10-CM

## 2019-09-27 MED ORDER — LOSARTAN POTASSIUM 50 MG PO TABS: 50.0000 mg | ORAL_TABLET | Freq: Every day | ORAL | 1 refills | Status: DC

## 2019-09-27 MED ORDER — VENLAFAXINE HCL ER 75 MG PO CP24: 75.0000 mg | ORAL_CAPSULE | Freq: Two times a day (BID) | ORAL | 1 refills | Status: DC

## 2019-09-27 MED ORDER — PHENTERMINE HCL 37.5 MG PO CAPS: ORAL_CAPSULE | ORAL | 1 refills | Status: DC

## 2019-09-27 NOTE — Progress Notes (Signed)
09/27/2019  Jason [name] [birth year] [id number]    BERK PILOT is a 29 y.o. male who presents for follow up of anxiety disorder. Current symptoms: fatigue. He denies current suicidal ideation.  Feels that the Effexor is working well for him.  Due to the fatigue, has not always taken the 2nd dose but it seems to be a better medication overall   Hypertension: Patient here for follow-up of elevated blood pressure.  Cozaar seems to working better for him.  BP is better controlled today.  Denies chest pain, pressure, SOB.   Obesity/Weight Loss  Patient has lost weight:  391 initial visit in 2/21 and now 377.  Overall is feeling better.  Phentermine is causing dry mouth.  But, he feels it is helpful.     The following portions of the patient's history were reviewed and updated as appropriate:  He  has a past medical history of Anxiety, Asthma, Depression, Gout, Hyperinsulinemia, Hypertension, Migraine with aura, and Morbid obesity ([place]). He does not have any pertinent problems on file. His family history includes Hypertension in his brother, father, maternal grandfather, and paternal grandmother. He  reports that he has never smoked. He has never used smokeless tobacco. He reports current alcohol use. He reports that he does not use drugs. Current Outpatient Medications on File Prior to Visit  Medication Sig Dispense Refill  . acetaminophen (TYLENOL) 325 MG tablet Take 650 mg by mouth every 6 (six) hours as needed.    Marland Kitchen lisinopril-hydrochlorothiazide (ZESTORETIC) 20-25 MG tablet Take 1 tablet by mouth daily. 30 tablet 3  . naproxen (NAPROSYN) 500 MG tablet Take 1 tablet (500 mg total) by mouth 2 (two) times daily with a meal. 14 tablet 0  . phentermine 37.5 MG capsule Take 1 capsule (37.5 mg total) by mouth every morning. 30 capsule 1  . QUEtiapine (SEROQUEL) 25 MG tablet Take 1 tablet (25 mg total) by mouth at bedtime. 30 tablet 1  . venlafaxine XR (EFFEXOR-XR) 75 MG 24 hr capsule 1  tab for 7 days, then increase to 2 tabs daily 60 capsule 1  . clonazePAM (KLONOPIN) 0.5 MG tablet 1/2 to 1 tab q 12 hours prn for anxiety (Patient not taking: Reported on 09/27/2019) 30 tablet 1   No current facility-administered medications on file prior to visit.   He is allergic to codeine..    Objective:    BP 137/90   Pulse (!) 103   Temp 97.9 F (36.6 C) (Temporal)   Ht 6' (1.829 m)   Wt (!) 377 lb (171 kg)   SpO2 98%   BMI 51.13 kg/m   General appearance: alert, well appearing, and in no distress, oriented to person, place, and time and anxious. Chest: clear to auscultation, no wheezes, rales or rhonchi, symmetric air entry.  CVS exam: normal rate, regular rhythm, normal S1, S2, no murmurs, rubs, clicks or gallops. Skin exam - normal coloration and turgor, no rashes, no suspicious skin lesions noted. Mental Status: normal mood, behavior, speech, dress, motor activity, and thought processes, anxious.   GAD-7, PHQ-o     PHQ9 SCORE ONLY 09/27/2019 08/23/2019 07/26/2019  Score 16 18 20       Assessment:   1. Obsessive thinking       Plan:    Medications: Due to fatigue, change Paxil to Effexor.. Instructed patient to contact office or on-call physician promptly should condition worsen or any new symptoms appear and provided on-call telephone numbers. IF THE PATIENT HAS ANY SUICIDAL  OR HOMICIDAL IDEATIONS, CALL THE OFFICE, DISCUSS WITH A SUPPORT MEMBER, OR GO TO THE ER IMMEDIATELY. Patient was agreeable with this plan. Follow up: 4 weeks. Spent 25 minutes (>50% of visit) discussing the risks of anxiety disorder and sleep disturbance, the  pathophysiology, etiology, risks, and principles of treatment.     No orders of the defined types were placed in this encounter.  Meds ordered this encounter  Medications  . phentermine 37.5 MG capsule    Sig: 1-2 tabs daily at breakfast and lunch for weight loss    Dispense:  120 capsule    Refill:  1  . losartan (COZAAR) 50 MG  tablet    Sig: Take 1 tablet (50 mg total) by mouth daily.    Dispense:  90 tablet    Refill:  1  . venlafaxine XR (EFFEXOR-XR) 75 MG 24 hr capsule    Sig: Take 1 capsule (75 mg total) by mouth in the morning and at bedtime. 1 tab for 7 days, then increase to 2 tabs daily    Dispense:  120 capsule    Refill:  1   No orders of the defined types were placed in this encounter.   Elyse Jarvis, NP

## 2019-09-27 NOTE — Patient Instructions (Signed)
° ° ° °  If you have lab work done today you will be contacted with your lab results within the next 2 weeks.  If you have not heard from us then please contact us. The fastest way to get your results is to register for My Chart. ° ° °IF you received an x-ray today, you will receive an invoice from [facility]. Please contact [facility] at [phone] with questions or concerns regarding your invoice.  ° °IF you received labwork today, you will receive an invoice from [facility]. Please contact [facility] at [phone] with questions or concerns regarding your invoice.  ° °Our billing staff will not be able to assist you with questions regarding bills from these companies. ° °You will be contacted with the lab results as soon as they are available. The fastest way to get your results is to activate your My Chart account. Instructions are located on the last page of this paperwork. If you have not heard from us regarding the results in 2 weeks, please contact this office. °  ° ° ° °

## 2019-11-22 ENCOUNTER — Telehealth: Payer: Self-pay | Admitting: Registered Nurse

## 2019-11-22 NOTE — Telephone Encounter (Signed)
Error

## 2019-11-29 ENCOUNTER — Ambulatory Visit: Payer: BC Managed Care – PPO | Admitting: Adult Health Nurse Practitioner

## 2019-12-13 ENCOUNTER — Ambulatory Visit (INDEPENDENT_AMBULATORY_CARE_PROVIDER_SITE_OTHER): Payer: BC Managed Care – PPO | Admitting: Registered Nurse

## 2019-12-13 ENCOUNTER — Encounter: Payer: Self-pay | Admitting: Registered Nurse

## 2019-12-13 ENCOUNTER — Other Ambulatory Visit: Payer: Self-pay

## 2019-12-13 VITALS — BP 126/81 | HR 82 | Temp 97.3°F | Ht 72.0 in | Wt 372.0 lb

## 2019-12-13 DIAGNOSIS — R0681 Apnea, not elsewhere classified: Secondary | ICD-10-CM

## 2019-12-13 DIAGNOSIS — F329 Major depressive disorder, single episode, unspecified: Secondary | ICD-10-CM

## 2019-12-13 DIAGNOSIS — F419 Anxiety disorder, unspecified: Secondary | ICD-10-CM | POA: Diagnosis not present

## 2019-12-13 DIAGNOSIS — Z6841 Body Mass Index (BMI) 40.0 and over, adult: Secondary | ICD-10-CM

## 2019-12-13 DIAGNOSIS — F32A Depression, unspecified: Secondary | ICD-10-CM

## 2019-12-13 MED ORDER — PHENTERMINE HCL 8 MG PO TABS: 1.0000 | ORAL_TABLET | Freq: Three times a day (TID) | ORAL | 4 refills | Status: DC

## 2019-12-13 MED ORDER — VENLAFAXINE HCL ER 75 MG PO CP24: 75.0000 mg | ORAL_CAPSULE | Freq: Two times a day (BID) | ORAL | 3 refills | Status: DC

## 2019-12-13 NOTE — Progress Notes (Signed)
Established Patient Office Visit  Subjective:  Patient ID: Jason [name], male    DOB: 1990/11/06  Age: 29 y.o. MRN: [id number]  CC:  Chief Complaint  Patient presents with  . Medication Management    phentermine- takes 2 daily , venlafaxine- drowzyness  . Depression    phq 9 - 14    HPI Jason [name] presents for follow up for anxiety, depression, and obesity  Anxiety and depression: Has been on venlafaxine 75mg  PO bid in the past with good effect. Wishes to continue. Has some sedative effect, but not enough to have him stop.  Obesity: , NP, had had him on phentermine 37.5 PO bid - higher than suggested maximum dose. Pt is uncomfortable with this but he does like the effect, curious about alternative dosing. He has lost about 20lbs since we first saw him in February for this.  Does note that snoring continues to be an issue for him. Wakes with headaches, much daytime sleepiness, and dry mouth on waking. Has reports of witnessed apnea. Interested in sleep study.  Otherwise, feeling well and without complaint.   Past Medical History:  Diagnosis Date  . Anxiety   . Asthma   . Depression   . Gout   . Hyperinsulinemia   . Hypertension   . Migraine with aura   . Morbid obesity (HCC)     Past Surgical History:  Procedure Laterality Date  . TONSILLECTOMY      Family History  Problem Relation Age of Onset  . Hypertension Father   . Hypertension Maternal Grandfather   . Hypertension Paternal Grandmother   . Hypertension Brother     Social History   Socioeconomic History  . Marital status: Single    Spouse name: Not on file  . Number of children: Not on file  . Years of education: Not on file  . Highest education level: Not on file  Occupational History  . Not on file  Tobacco Use  . Smoking status: Never Smoker  . Smokeless tobacco: Never Used  Substance and Sexual Activity  . Alcohol use: Yes    Alcohol/week: 0.0 standard drinks    Comment:  occasional  . Drug use: No  . Sexual activity: Not on file  Other Topics Concern  . Not on file  Social History Narrative  . Not on file   Social Determinants of Health   Financial Resource Strain:   . Difficulty of Paying Living Expenses:   Food Insecurity:   . Worried About March in the Last Year:   . Programme researcher, broadcasting/film/video in the Last Year:   Transportation Needs:   . Barista (Medical):   Freight forwarder Lack of Transportation (Non-Medical):   Physical Activity:   . Days of Exercise per Week:   . Minutes of Exercise per Session:   Stress:   . Feeling of Stress :   Social Connections:   . Frequency of Communication with Friends and Family:   . Frequency of Social Gatherings with Friends and Family:   . Attends Religious Services:   . Active Member of Clubs or Organizations:   . Attends Marland Kitchen Meetings:   Banker Marital Status:   Intimate Partner Violence:   . Fear of Current or Ex-Partner:   . Emotionally Abused:   Marland Kitchen Physically Abused:   . Sexually Abused:     Outpatient Medications Prior to Visit  Medication Sig Dispense Refill  . acetaminophen (TYLENOL)  325 MG tablet Take 650 mg by mouth every 6 (six) hours as needed.    Marland Kitchen losartan (COZAAR) 50 MG tablet Take 1 tablet (50 mg total) by mouth daily. 90 tablet 1  . phentermine 37.5 MG capsule 1-2 tabs daily at breakfast and lunch for weight loss 120 capsule 1  . venlafaxine XR (EFFEXOR-XR) 75 MG 24 hr capsule Take 1 capsule (75 mg total) by mouth in the morning and at bedtime. 1 tab for 7 days, then increase to 2 tabs daily 120 capsule 1  . clonazePAM (KLONOPIN) 0.5 MG tablet 1/2 to 1 tab q 12 hours prn for anxiety (Patient not taking: Reported on 12/13/2019) 30 tablet 1  . naproxen (NAPROSYN) 500 MG tablet Take 1 tablet (500 mg total) by mouth 2 (two) times daily with a meal. (Patient not taking: Reported on 12/13/2019) 14 tablet 0  . QUEtiapine (SEROQUEL) 25 MG tablet Take 1 tablet (25 mg total) by mouth  at bedtime. (Patient not taking: Reported on 12/13/2019) 30 tablet 1   No facility-administered medications prior to visit.    Allergies  Allergen Reactions  . Codeine Nausea And Vomiting    ROS Review of Systems  Constitutional: Positive for fatigue. Negative for activity change, appetite change, chills, diaphoresis, fever and unexpected weight change.  HENT: Negative.   Eyes: Negative.   Respiratory: Positive for apnea. Negative for cough, choking, chest tightness, shortness of breath, wheezing and stridor.   Cardiovascular: Negative.   Gastrointestinal: Negative.   Endocrine: Negative.   Genitourinary: Negative.   Musculoskeletal: Negative.   Skin: Negative.   Allergic/Immunologic: Negative.   Neurological: Negative.   Hematological: Negative.   Psychiatric/Behavioral: Negative.   All other systems reviewed and are negative.     Objective:    Physical Exam Vitals and nursing note reviewed.  Constitutional:      General: He is not in acute distress.    Appearance: Normal appearance. He is obese. He is not ill-appearing, toxic-appearing or diaphoretic.  Cardiovascular:     Rate and Rhythm: Normal rate and regular rhythm.     Pulses: Normal pulses.     Heart sounds: Normal heart sounds.  Pulmonary:     Effort: Pulmonary effort is normal. No respiratory distress.  Musculoskeletal:        General: No swelling, tenderness, deformity or signs of injury. Normal range of motion.     Right lower leg: No edema.     Left lower leg: No edema.  Skin:    General: Skin is warm and dry.     Coloration: Skin is not jaundiced or pale.     Findings: No bruising, erythema, lesion or rash.  Neurological:     General: No focal deficit present.     Mental Status: He is alert and oriented to person, place, and time. Mental status is at baseline.  Psychiatric:        Mood and Affect: Mood normal.        Behavior: Behavior normal.        Thought Content: Thought content normal.         Judgment: Judgment normal.     BP 126/81   Pulse 82   Temp (!) 97.3 F (36.3 C)   Ht 6' (1.829 m)   Wt (!) 372 lb (168.7 kg)   SpO2 96%   BMI 50.45 kg/m  Wt Readings from Last 3 Encounters:  12/13/19 (!) 372 lb (168.7 kg)  09/27/19 (!) 377 lb (171 kg)  08/23/19 (!) 391 lb (177.4 kg)     Health Maintenance Due  Topic Date Due  . Hepatitis C Screening  Never done  . COVID-19 Vaccine (1) Never done  . HIV Screening  Never done    There are no preventive care reminders to display for this patient.  Lab Results  Component Value Date   TSH 1.760 07/26/2019   Lab Results  Component Value Date   WBC 17.0 (H) 01/04/2016   HGB 15.9 01/04/2016   HCT 45.4 01/04/2016   MCV 82.2 01/04/2016   PLT 287 01/04/2016   Lab Results  Component Value Date   NA 139 07/26/2019   K 4.3 07/26/2019   CO2 21 07/26/2019   GLUCOSE 93 07/26/2019   BUN 12 07/26/2019   CREATININE 1.03 07/26/2019   BILITOT 0.9 07/26/2019   ALKPHOS 104 07/26/2019   AST 26 07/26/2019   ALT 44 07/26/2019   PROT 7.4 07/26/2019   ALBUMIN 4.5 07/26/2019   CALCIUM 9.3 07/26/2019   ANIONGAP 9 01/04/2016   Lab Results  Component Value Date   CHOL 106 07/23/2016   Lab Results  Component Value Date   HDL 27 (L) 07/23/2016   Lab Results  Component Value Date   LDLCALC 56 07/23/2016   Lab Results  Component Value Date   TRIG 115 07/23/2016   Lab Results  Component Value Date   CHOLHDL 3.9 07/23/2016   No results found for: HGBA1C    Assessment & Plan:   Problem List Items Addressed This Visit      Other   Anxiety and depression (Chronic)   Relevant Medications   venlafaxine XR (EFFEXOR-XR) 75 MG 24 hr capsule   Class 3 severe obesity due to excess calories without serious comorbidity with body mass index (BMI) of 50.0 to 59.9 in adult ([place]) - Primary (Chronic)   Relevant Medications   Phentermine HCl 8 MG TABS      Meds ordered this encounter  Medications  . venlafaxine XR  (EFFEXOR-XR) 75 MG 24 hr capsule    Sig: Take 1 capsule (75 mg total) by mouth in the morning and at bedtime. 1 tab for 7 days, then increase to 2 tabs daily    Dispense:  180 capsule    Refill:  3    Order Specific Question:   Supervising Provider    Answer:   Carlota Raspberry, JEFFREY R [[id number]]  . Phentermine HCl 8 MG TABS    Sig: Take 1 tablet by mouth with breakfast, with lunch, and with evening meal.    Dispense:  84 tablet    Refill:  4    Order Specific Question:   Supervising Provider    Answer:   Carlota Raspberry, JEFFREY R [[id number]]    Follow-up: No follow-ups on file.   PLAN  Refer to pulmonology for sleep study  Continue venlafaxine  Change phentermine dose to 8mg  PO qd with meals  Return in about 3 months  Patient encouraged to call clinic with any questions, comments, or concerns.  Maximiano Coss, NP

## 2019-12-13 NOTE — Patient Instructions (Signed)
° ° ° °  If you have lab work done today you will be contacted with your lab results within the next 2 weeks.  If you have not heard from us then please contact us. The fastest way to get your results is to register for My Chart. ° ° °IF you received an x-ray today, you will receive an invoice from [facility]. Please contact [facility] at [phone] with questions or concerns regarding your invoice.  ° °IF you received labwork today, you will receive an invoice from [facility]. Please contact [facility] at [phone] with questions or concerns regarding your invoice.  ° °Our billing staff will not be able to assist you with questions regarding bills from these companies. ° °You will be contacted with the lab results as soon as they are available. The fastest way to get your results is to activate your My Chart account. Instructions are located on the last page of this paperwork. If you have not heard from us regarding the results in 2 weeks, please contact this office. °  ° ° ° °

## 2019-12-16 ENCOUNTER — Emergency Department (HOSPITAL_COMMUNITY)
Admission: EM | Admit: 2019-12-16 | Discharge: 2019-12-16 | Disposition: A | Discharge status: Home / Self Care | Payer: Self-pay | Attending: Emergency Medicine | Admitting: Emergency Medicine

## 2019-12-16 ENCOUNTER — Encounter (HOSPITAL_COMMUNITY): Payer: Self-pay | Admitting: *Deleted

## 2019-12-16 ENCOUNTER — Other Ambulatory Visit: Payer: Self-pay

## 2019-12-16 DIAGNOSIS — M10072 Idiopathic gout, left ankle and foot: Secondary | ICD-10-CM | POA: Insufficient documentation

## 2019-12-16 DIAGNOSIS — I10 Essential (primary) hypertension: Secondary | ICD-10-CM | POA: Insufficient documentation

## 2019-12-16 DIAGNOSIS — J45909 Unspecified asthma, uncomplicated: Secondary | ICD-10-CM | POA: Insufficient documentation

## 2019-12-16 DIAGNOSIS — M109 Gout, unspecified: Secondary | ICD-10-CM

## 2019-12-16 DIAGNOSIS — Z79899 Other long term (current) drug therapy: Secondary | ICD-10-CM | POA: Insufficient documentation

## 2019-12-16 MED ORDER — PREDNISONE 10 MG PO TABS: 10.0000 mg | ORAL_TABLET | Freq: Every day | ORAL | 0 refills | Status: DC

## 2019-12-16 MED ORDER — PREDNISONE 20 MG PO TABS
20.0000 mg | ORAL_TABLET | Freq: Every day | ORAL | 0 refills | Status: DC
Start: 2019-12-16 — End: 2019-12-16

## 2019-12-16 MED ORDER — PREDNISONE 50 MG PO TABS
ORAL_TABLET | ORAL | 0 refills | Status: DC
Start: 2019-12-16 — End: 2019-12-16

## 2019-12-16 MED ORDER — PREDNISONE 20 MG PO TABS
40.0000 mg | ORAL_TABLET | Freq: Every day | ORAL | 0 refills | Status: DC
Start: 2019-12-16 — End: 2019-12-16

## 2019-12-16 MED ORDER — PREDNISONE 10 MG (21) PO TBPK
ORAL_TABLET | Freq: Every day | ORAL | 0 refills | Status: DC
Start: 2019-12-16 — End: 2021-08-24

## 2019-12-16 MED ORDER — PREDNISONE 20 MG PO TABS: 20.0000 mg | ORAL_TABLET | Freq: Every day | ORAL | 0 refills | Status: DC

## 2019-12-16 MED ORDER — PREDNISONE 10 MG (21) PO TBPK
ORAL_TABLET | Freq: Every day | ORAL | 0 refills | Status: DC
Start: 2019-12-16 — End: 2019-12-16

## 2019-12-16 MED ORDER — PREDNISONE 50 MG PO TABS
60.0000 mg | ORAL_TABLET | Freq: Once | ORAL | Status: AC
  Administered 2019-12-16: 60 mg via ORAL
  Filled 2019-12-16: qty 1

## 2019-12-16 NOTE — ED Provider Notes (Signed)
[facility] EMERGENCY DEPARTMENT Provider Note   CSN: [id number] Arrival date & time: 12/16/19  1136     History Chief Complaint  Patient presents with  . Gout    Jason [name] is a 29 y.o. male.  HPI 29 year old male with a history of morbid obesity, gout, essential hypertension, hyperinsulinemia, anxiety and depression presents to the ER with swelling to his left ankle x1 day.  Patient states that this is consistent with his previous gout flares.  He says normally he will get them in his right ankle, but it always affects his ankles.  He is not on any preventative medications.  He has not noticed swelling to his left ankle last night, he admits to alcohol consumption and poorly controlled diet.  He is not on any preventatives.   He is having some pain on movement, states it has been limited in his walking since yesterday.  He states that sometimes he will do TeleMed with his primary care provider they will give him indomethacin.  However he states that once his symptoms get out of control like it is today, normally prednisone works for him.  Denies any fevers, chills, numbness, weakness, IV drug use, erythema to the area, chest pain, shortness of breath. He has not taken anything for symptoms.  Denies any recent falls, or injuries.    Past Medical History:  Diagnosis Date  . Anxiety   . Asthma   . Depression   . Gout   . Hyperinsulinemia   . Hypertension   . Migraine with aura   . Morbid obesity [facility])     Patient Active Problem List   Diagnosis Date Noted  . Need for prophylactic vaccination with combined diphtheria-tetanus-pertussis (DTP) vaccine 08/23/2019  . Other insomnia 07/26/2019  . Class 3 severe obesity due to excess calories without serious comorbidity with body mass index (BMI) of 50.0 to 59.9 in adult ([facility]) 07/26/2019  . Obsessive thinking 11/19/2016  . Essential hypertension 07/22/2016  . Hyperinsulinemia 10/30/2012  . Anxiety and depression 10/30/2012  .  Morbid obesity ([facility]) 10/30/2012    Past Surgical History:  Procedure Laterality Date  . TONSILLECTOMY         Family History  Problem Relation Age of Onset  . Hypertension Father   . Hypertension Maternal Grandfather   . Hypertension Paternal Grandmother   . Hypertension Brother     Social History   Tobacco Use  . Smoking status: Never Smoker  . Smokeless tobacco: Never Used  Substance Use Topics  . Alcohol use: Yes    Alcohol/week: 0.0 standard drinks    Comment: occasional  . Drug use: No    Home Medications Prior to Admission medications   Medication Sig Start Date End Date Taking? Authorizing Provider  acetaminophen (TYLENOL) 325 MG tablet Take 650 mg by mouth every 6 (six) hours as needed.    [provider]  losartan (COZAAR) 50 MG tablet Take 1 tablet (50 mg total) by mouth daily. 09/27/19 12/26/19  Royal Hawthorn, NP  Phentermine HCl 8 MG TABS Take 1 tablet by mouth with breakfast, with lunch, and with evening meal. 12/13/19   Janeece Agee, NP  predniSONE (STERAPRED UNI-PAK 21 TAB) 10 MG (21) TBPK tablet Take by mouth daily. Take 6 tabs by mouth daily  for 2 days, then 5 tabs for 2 days, then 4 tabs for 2 days, then 3 tabs for 2 days, 2 tabs for 2 days, then 1 tab by mouth daily for 2  days 12/16/19   Mare Ferrari, PA-C  venlafaxine XR (EFFEXOR-XR) 75 MG 24 hr capsule Take 1 capsule (75 mg total) by mouth in the morning and at bedtime. 1 tab for 7 days, then increase to 2 tabs daily 12/13/19   Janeece Agee, NP    Allergies    Codeine  Review of Systems   Review of Systems  Constitutional: Negative for chills and fever.  Musculoskeletal: Positive for arthralgias and joint swelling.  Skin: Negative for rash.  Neurological: Negative for weakness and numbness.    Physical Exam Updated Vital Signs BP (!) 144/88 (BP Location: Right Arm)   Pulse 97   Temp 98.7 F (37.1 C) (Oral)   Resp 16   Ht 6' (1.829 m)   Wt (!) 165.6 kg   SpO2 100%    BMI 49.50 kg/m   Physical Exam Vitals and nursing note reviewed.  Constitutional:      General: He is not in acute distress.    Appearance: Normal appearance. He is well-developed. He is obese. He is not ill-appearing, toxic-appearing or diaphoretic.  HENT:     Head: Normocephalic and atraumatic.     Mouth/Throat:     Mouth: Mucous membranes are moist.     Pharynx: Oropharynx is clear.  Eyes:     Conjunctiva/sclera: Conjunctivae normal.  Cardiovascular:     Rate and Rhythm: Normal rate and regular rhythm.     Pulses: Normal pulses.     Heart sounds: Normal heart sounds. No murmur heard.   Pulmonary:     Effort: Pulmonary effort is normal. No respiratory distress.     Breath sounds: Normal breath sounds.  Abdominal:     General: Abdomen is flat.     Palpations: Abdomen is soft.     Tenderness: There is no abdominal tenderness.  Musculoskeletal:        General: Swelling and tenderness present. No deformity.     Cervical back: Normal range of motion and neck supple.     Right lower leg: No edema.     Left lower leg: Edema present.     Comments: Left ankle with mild warmth, moderate swelling, no overlying erythema.  Pain on range of motion, however full range of motion with passive flexion and extension. Pain to the lateral malleolus; achilles tendon with no evidence of disruption. Mild soreness to medial malleolus.  Negative Homan's. Full ROM of toes, no pitting edma.   No signs of fluctuance, drainage.  Sensations intact.  2+ DP pulses; <2 cap refill     Skin:    General: Skin is warm and dry.     Capillary Refill: Capillary refill takes less than 2 seconds.     Findings: No bruising, erythema or rash.  Neurological:     General: No focal deficit present.     Mental Status: He is alert and oriented to person, place, and time.  Psychiatric:        Mood and Affect: Mood normal.        Behavior: Behavior normal.     ED Results / Procedures / Treatments   Labs (all labs  ordered are listed, but only abnormal results are displayed) Labs Reviewed - No data to display  EKG None  Radiology No results found.  Procedures Procedures (including critical care time)  Medications Ordered in ED Medications  predniSONE (DELTASONE) tablet 60 mg (60 mg Oral Given 12/16/19 1426)    ED Course  I have reviewed the triage vital  signs and the nursing notes.  Pertinent labs & imaging results that were available during my care of the patient were reviewed by me and considered in my medical decision making (see chart for details).    MDM Rules/Calculators/A&P                          Pt presents with monoarticular pain, swelling, mild warmth and no erythema.  Pt is afebrile and stable.  I do not think any additional imaging is needed at this time as the patient denies any recent injuries or falls.  He states that his symptoms are very consistent with his previous gout flares.  No history of IV drug use, doubt septic joint.  Reports drinking.  Poorly controlled diet.  No neurovascular disorder.  Patient will be treated with oral steroids, encouraged NSAIDs.  Discussed that pt should respond to treatment with in 24 hour of begining treatment & likely resolve in 2-3 days.  I encouraged the patient to follow-up with his PCP to possibly start preventative medication. Strict return precautions given.   Patient voices understanding and is agreeable to this plan.At this stage in the ED course the patient has been adequately screened and is stable for discharge.   Final Clinical Impression(s) / ED Diagnoses Final diagnoses:  Acute gout of left ankle, unspecified cause        Mare Ferrari, PA-C 12/16/19 1429    Terald Sleeper, MD 12/16/19 [phone]

## 2019-12-16 NOTE — Discharge Instructions (Signed)
Please take the prednisone as prescribed.  Please make sure to follow-up with your primary care doctor in order to possibly start some preventative medication for gout.  These make sure to watch her diet and alcohol intake.  Return to the ER if you have worsening swelling, numbness fevers, chills, etc.

## 2019-12-16 NOTE — ED Triage Notes (Signed)
Pt c/o gout flareup to left ankle; pt states he started having pain x 3 days ago and last night he started having swelling

## 2020-01-24 ENCOUNTER — Ambulatory Visit: Payer: BC Managed Care – PPO | Admitting: Registered Nurse

## 2020-02-20 ENCOUNTER — Other Ambulatory Visit: Payer: Self-pay

## 2020-02-20 ENCOUNTER — Other Ambulatory Visit: Payer: Self-pay | Admitting: Critical Care Medicine

## 2020-02-20 DIAGNOSIS — Z20822 Contact with and (suspected) exposure to covid-19: Secondary | ICD-10-CM

## 2020-02-21 ENCOUNTER — Institutional Professional Consult (permissible substitution): Payer: Self-pay | Admitting: Pulmonary Disease

## 2020-02-22 LAB — NOVEL CORONAVIRUS, NAA: SARS-CoV-2, NAA: NOT DETECTED

## 2020-04-20 DIAGNOSIS — M109 Gout, unspecified: Secondary | ICD-10-CM | POA: Diagnosis not present

## 2020-04-20 DIAGNOSIS — M79674 Pain in right toe(s): Secondary | ICD-10-CM | POA: Diagnosis not present

## 2021-08-24 ENCOUNTER — Encounter: Payer: Self-pay | Admitting: Family Medicine

## 2021-08-24 ENCOUNTER — Other Ambulatory Visit: Payer: Self-pay

## 2021-08-24 ENCOUNTER — Ambulatory Visit (INDEPENDENT_AMBULATORY_CARE_PROVIDER_SITE_OTHER): Payer: BC Managed Care – PPO | Admitting: Family Medicine

## 2021-08-24 VITALS — BP 143/96 | HR 86 | Temp 98.4°F | Ht 70.25 in | Wt 372.0 lb

## 2021-08-24 DIAGNOSIS — F419 Anxiety disorder, unspecified: Secondary | ICD-10-CM

## 2021-08-24 DIAGNOSIS — I10 Essential (primary) hypertension: Secondary | ICD-10-CM | POA: Diagnosis not present

## 2021-08-24 DIAGNOSIS — Z130 Encounter for screening for diseases of the blood and blood-forming organs and certain disorders involving the immune mechanism: Secondary | ICD-10-CM | POA: Diagnosis not present

## 2021-08-24 DIAGNOSIS — F32A Depression, unspecified: Secondary | ICD-10-CM

## 2021-08-24 DIAGNOSIS — Z6841 Body Mass Index (BMI) 40.0 and over, adult: Secondary | ICD-10-CM

## 2021-08-24 MED ORDER — BUPROPION HCL ER (XL) 150 MG PO TB24: 150.0000 mg | ORAL_TABLET | Freq: Every day | ORAL | 0 refills | Status: DC

## 2021-08-24 MED ORDER — SEMAGLUTIDE-WEIGHT MANAGEMENT 0.25 MG/0.5ML ~~LOC~~ SOAJ: 0.2500 mg | SUBCUTANEOUS | 0 refills | Status: DC

## 2021-08-24 NOTE — Progress Notes (Signed)
? ?Subjective:  ?Patient ID: Jason [name], male    DOB: [birth year]  Age: 31 y.o. MRN: [id number] ? ?CC: ?Chief Complaint  ?Patient presents with  ? Establish Care  ?  Pt would like to get back on depression meds  ? ? ?HPI: ? ?31 year old male presents to establish care. ? ?Patient suffers from anxiety and depression.  He was previously on Effexor.  He was also started on phentermine.  Patient reports that he is currently off his medications.  He would like to discuss medication for anxiety and depression.  He states that he has a lot of anxiety particularly regarding his job.  He recently got a promotion.  His son recently diagnosed with seizures.  This is also stressful.  GAD-7 score of 14.  PHQ-9 score of 19.  Patient states that he struggles expressing his emotions.  He states that this stems from childhood.  Patient states that he has taken Zoloft in the past as well.  He states that the Effexor made him feel groggy. ? ?Patient has hypertension.  No meds at this time. ? ?Patient lost weight with phentermine.  No current medications regarding weight loss. ? ?Patient Active Problem List  ? Diagnosis Date Noted  ? Class 3 severe obesity due to excess calories without serious comorbidity with body mass index (BMI) of 50.0 to 59.9 in adult [facility]) 07/26/2019  ? Essential hypertension 07/22/2016  ? Anxiety and depression 10/30/2012  ? ? ?Social Hx   ?Social History  ? ?Socioeconomic History  ? Marital status: Single  ?  Spouse name: Not on file  ? Number of children: Not on file  ? Years of education: Not on file  ? Highest education level: Not on file  ?Occupational History  ? Not on file  ?Tobacco Use  ? Smoking status: Never  ? Smokeless tobacco: Never  ?Substance and Sexual Activity  ? Alcohol use: Yes  ?  Alcohol/week: 0.0 standard drinks  ?  Comment: occasional  ? Drug use: No  ? Sexual activity: Not on file  ?Other Topics Concern  ? Not on file  ?Social History Narrative  ? Not on file  ? ?Social Determinants  of Health  ? ?Financial Resource Strain: Not on file  ?Food Insecurity: Not on file  ?Transportation Needs: Not on file  ?Physical Activity: Not on file  ?Stress: Not on file  ?Social Connections: Not on file  ? ? ?Review of Systems  ?Constitutional:   ?     Weight gain.  ?Psychiatric/Behavioral:    ?     Depression and anxiety.  ? ? ?Objective:  ?BP (!) 143/96   Pulse 86   Temp 98.4 ?F (36.9 ?C)   Ht 5' 10.25" (1.784 m)   Wt (!) 372 lb (168.7 kg)   SpO2 96%   BMI 53.00 kg/m?  ? ?BP/Weight 08/24/2021 12/16/2019 12/13/2019  ?Systolic BP 767 341 937  ?Diastolic BP 96 99 81  ?Wt. (Lbs) 372 365 372  ?BMI 53 49.5 50.45  ? ? ?Physical Exam ?Vitals and nursing note reviewed.  ?Constitutional:   ?   General: He is not in acute distress. ?   Appearance: Normal appearance. He is obese.  ?HENT:  ?   Head: Normocephalic and atraumatic.  ?Eyes:  ?   General:     ?   Right eye: No discharge.     ?   Left eye: No discharge.  ?   Conjunctiva/sclera: Conjunctivae normal.  ?Cardiovascular:  ?  Rate and Rhythm: Normal rate and regular rhythm.  ?   Heart sounds: No murmur heard. ?Pulmonary:  ?   Effort: Pulmonary effort is normal.  ?   Breath sounds: Normal breath sounds. No wheezing, rhonchi or rales.  ?Neurological:  ?   Mental Status: He is alert.  ?Psychiatric:     ?   Behavior: Behavior normal.  ?   Comments: Tearful.  ? ? ?Lab Results  ?Component Value Date  ? WBC 17.0 (H) 01/04/2016  ? HGB 15.9 01/04/2016  ? HCT 45.4 01/04/2016  ? PLT 287 01/04/2016  ? GLUCOSE 93 07/26/2019  ? CHOL 106 07/23/2016  ? TRIG 115 07/23/2016  ? HDL 27 (L) 07/23/2016  ? [place] 56 07/23/2016  ? ALT 44 07/26/2019  ? AST 26 07/26/2019  ? NA 139 07/26/2019  ? K 4.3 07/26/2019  ? CL 104 07/26/2019  ? CREATININE 1.03 07/26/2019  ? BUN 12 07/26/2019  ? CO2 21 07/26/2019  ? TSH 1.760 07/26/2019  ? ? ? ?Assessment & Plan:  ? ?Problem List Items Addressed This Visit   ? ?  ? Cardiovascular and Mediastinum  ? Essential hypertension - Primary  ?  BP elevated  here today.  I believe that his hypertension will improve with weight loss.  We will continue to monitor. ?  ?  ? Relevant Orders  ? CMP14+EGFR  ?  ? Other  ? Anxiety and depression  ?  Trial of Wellbutrin.  Follow-up in 4 to 6 weeks. ?  ?  ? Relevant Medications  ? buPROPion (WELLBUTRIN XL) 150 MG 24 hr tablet  ? Class 3 severe obesity due to excess calories without serious comorbidity with body mass index (BMI) of 50.0 to 59.9 in adult [facility])  ?  Discussed Wegovy today.  We will send in Rx and see if we can get this approved. ?  ?  ? Relevant Medications  ? Semaglutide-Weight Management 0.25 MG/0.5ML SOAJ  ? Other Relevant Orders  ? Hemoglobin A1c  ? Lipid panel  ? ?Other Visit Diagnoses   ? ? Screening for deficiency anemia      ? Relevant Orders  ? CBC  ? ?  ? ? ?Meds ordered this encounter  ?Medications  ? buPROPion (WELLBUTRIN XL) 150 MG 24 hr tablet  ?  Sig: Take 1 tablet (150 mg total) by mouth daily.  ?  Dispense:  90 tablet  ?  Refill:  0  ? Semaglutide-Weight Management 0.25 MG/0.5ML SOAJ  ?  Sig: Inject 0.25 mg into the skin once a week.  ?  Dispense:  2 mL  ?  Refill:  0  ? ? ?Follow-up:  Return for F/U in 4-6 weeks . ? ?Thersa Salt DO ?[place] ? ?

## 2021-08-24 NOTE — Patient Instructions (Signed)
Medication as prescribed. ? ?Labs today. ? ?Follow up in 4-6 weeks. ? ?Take care ? ?Dr. Adriana Simas  ?

## 2021-08-24 NOTE — Assessment & Plan Note (Signed)
Trial of Wellbutrin.  Follow-up in 4 to 6 weeks. ?

## 2021-08-24 NOTE — Assessment & Plan Note (Signed)
Discussed Wegovy today.  We will send in Rx and see if we can get this approved. ?

## 2021-08-24 NOTE — Assessment & Plan Note (Signed)
BP elevated here today.  I believe that his hypertension will improve with weight loss.  We will continue to monitor. ?

## 2021-08-25 LAB — CBC
Hematocrit: 49 % (ref 37.5–51.0)
Hemoglobin: 16.6 g/dL (ref 13.0–17.7)
MCH: 27.9 pg (ref 26.6–33.0)
MCHC: 33.9 g/dL (ref 31.5–35.7)
MCV: 82 fL (ref 79–97)
Platelets: 302 10*3/uL (ref 150–450)
RBC: 5.96 x10E6/uL — ABNORMAL HIGH (ref 4.14–5.80)
RDW: 12.6 % (ref 11.6–15.4)
WBC: 10.5 10*3/uL (ref 3.4–10.8)

## 2021-08-25 LAB — CMP14+EGFR
ALT: 35 IU/L (ref 0–44)
AST: 23 IU/L (ref 0–40)
Albumin/Globulin Ratio: 1.8 (ref 1.2–2.2)
Albumin: 4.9 g/dL (ref 4.1–5.2)
Alkaline Phosphatase: 107 IU/L (ref 44–121)
BUN/Creatinine Ratio: 10 (ref 9–20)
BUN: 12 mg/dL (ref 6–20)
Bilirubin Total: 0.9 mg/dL (ref 0.0–1.2)
CO2: 24 mmol/L (ref 20–29)
Calcium: 9.8 mg/dL (ref 8.7–10.2)
Chloride: 101 mmol/L (ref 96–106)
Creatinine, Ser: 1.18 mg/dL (ref 0.76–1.27)
Globulin, Total: 2.8 g/dL (ref 1.5–4.5)
Glucose: 92 mg/dL (ref 70–99)
Potassium: 5 mmol/L (ref 3.5–5.2)
Sodium: 140 mmol/L (ref 134–144)
Total Protein: 7.7 g/dL (ref 6.0–8.5)
eGFR: 86 mL/min/{1.73_m2} (ref >59)

## 2021-08-25 LAB — HEMOGLOBIN A1C
Est. average glucose Bld gHb Est-mCnc: 111 mg/dL
Hgb A1c MFr Bld: 5.5 % (ref 4.8–5.6)

## 2021-08-25 LAB — LIPID PANEL
Chol/HDL Ratio: 3.5 ratio (ref 0.0–5.0)
Cholesterol, Total: 107 mg/dL (ref 100–199)
HDL: 31 mg/dL — ABNORMAL LOW (ref >39)
LDL Chol Calc (NIH): 54 mg/dL (ref 0–99)
Triglycerides: 123 mg/dL (ref 0–149)
VLDL Cholesterol Cal: 22 mg/dL (ref 5–40)

## 2021-09-21 ENCOUNTER — Encounter: Payer: Self-pay | Admitting: Family Medicine

## 2021-09-21 ENCOUNTER — Ambulatory Visit (INDEPENDENT_AMBULATORY_CARE_PROVIDER_SITE_OTHER): Payer: BC Managed Care – PPO | Admitting: Family Medicine

## 2021-09-21 ENCOUNTER — Telehealth: Payer: Self-pay | Admitting: Family Medicine

## 2021-09-21 DIAGNOSIS — F32A Depression, unspecified: Secondary | ICD-10-CM

## 2021-09-21 DIAGNOSIS — F419 Anxiety disorder, unspecified: Secondary | ICD-10-CM | POA: Diagnosis not present

## 2021-09-21 NOTE — Assessment & Plan Note (Signed)
We had a lengthy discussion today.  We talked about continue the medication, increasing dose, adding on medication regarding anxiety, and talking with psychologist/counselor.  Patient is amenable to the latter.  I gave him information regarding [facility] in [place].  We will continue Wellbutrin at current dose. ?

## 2021-09-21 NOTE — Telephone Encounter (Signed)
Reginal Lutes PA will need to be done through [facility] Rx Benefits [phone]. Attempted to get this taken care of today but was unable to stay on hold. Insurance card and pt info at nurses station.  ?

## 2021-09-21 NOTE — Progress Notes (Signed)
? ?Subjective:  ?Patient ID: ZAIDEN MCCULLEN, male    DOB: [birth year]  Age: 31 y.o. MRN: [id number] ? ?CC: ?Chief Complaint  ?Patient presents with  ? Depression  ?  No changes , just less moody  ? Obesity  ?  Unable to get medication from ins coverage  ? ? ?HPI: ? ?31 year old male presents for follow-up regarding anxiety and depression. ? ?Patient reports that he is noticing improvement in his mood on the Wellbutrin.  He states that he is less moody.  However, he is still struggling with anxiety.  GAD-7 score of 16 today.  PHQ-9 score of 16 as well.  He still struggles dealing with emotions and expressing emotions.  He tends to hold in his emotions and keeping to himself.  He would like to discuss treatment options to aid his symptoms today. ? ?Patient Active Problem List  ? Diagnosis Date Noted  ? Class 3 severe obesity due to excess calories without serious comorbidity with body mass index (BMI) of 50.0 to 59.9 in adult [facility]) 07/26/2019  ? Essential hypertension 07/22/2016  ? Anxiety and depression 10/30/2012  ? ? ?Social Hx   ?Social History  ? ?Socioeconomic History  ? Marital status: Single  ?  Spouse name: Not on file  ? Number of children: Not on file  ? Years of education: Not on file  ? Highest education level: Not on file  ?Occupational History  ? Not on file  ?Tobacco Use  ? Smoking status: Never  ? Smokeless tobacco: Never  ?Substance and Sexual Activity  ? Alcohol use: Yes  ?  Alcohol/week: 0.0 standard drinks  ?  Comment: occasional  ? Drug use: No  ? Sexual activity: Not on file  ?Other Topics Concern  ? Not on file  ?Social History Narrative  ? Not on file  ? ?Social Determinants of Health  ? ?Financial Resource Strain: Not on file  ?Food Insecurity: Not on file  ?Transportation Needs: Not on file  ?Physical Activity: Not on file  ?Stress: Not on file  ?Social Connections: Not on file  ? ? ?Review of Systems  ?Constitutional: Negative.   ?Psychiatric/Behavioral:    ?     Anxiety and depression.   ? ? ?Objective:  ?BP (!) 135/95   Pulse 75   Temp 97.7 ?F (36.5 ?C)   Ht 5' 10.25" (1.784 m)   Wt (!) 373 lb (169.2 kg)   SpO2 96%   BMI 53.14 kg/m?  ? ? ?  09/21/2021  ?  1:05 PM 08/24/2021  ? 10:01 AM 12/16/2019  ?  2:28 PM  ?BP/Weight  ?Systolic BP [id number]  ?Diastolic BP 95 96 99  ?Wt. (Lbs) 373 372   ?BMI 53.14 kg/m2 53 kg/m2   ? ? ?Physical Exam ?Vitals and nursing note reviewed.  ?Constitutional:   ?   General: He is not in acute distress. ?   Appearance: Normal appearance. He is obese.  ?HENT:  ?   Head: Normocephalic and atraumatic.  ?Pulmonary:  ?   Effort: Pulmonary effort is normal. No respiratory distress.  ?Neurological:  ?   Mental Status: He is alert.  ?Psychiatric:     ?   Mood and Affect: Mood normal.     ?   Behavior: Behavior normal.  ? ? ?Lab Results  ?Component Value Date  ? WBC 10.5 08/24/2021  ? HGB 16.6 08/24/2021  ? HCT 49.0 08/24/2021  ? PLT 302 08/24/2021  ? GLUCOSE 92 08/24/2021  ?  CHOL 107 08/24/2021  ? TRIG 123 08/24/2021  ? HDL 31 (L) 08/24/2021  ? LDLCALC 54 08/24/2021  ? ALT 35 08/24/2021  ? AST 23 08/24/2021  ? NA 140 08/24/2021  ? K 5.0 08/24/2021  ? CL 101 08/24/2021  ? CREATININE 1.18 08/24/2021  ? BUN 12 08/24/2021  ? CO2 24 08/24/2021  ? TSH 1.760 07/26/2019  ? HGBA1C 5.5 08/24/2021  ? ? ? ?Assessment & Plan:  ? ?Problem List Items Addressed This Visit   ? ?  ? Other  ? Anxiety and depression  ?  We had a lengthy discussion today.  We talked about continue the medication, increasing dose, adding on medication regarding anxiety, and talking with psychologist/counselor.  Patient is amenable to the latter.  I gave him information regarding [facility] in [place].  We will continue Wellbutrin at current dose. ?  ?  ? ?Thersa Salt DO ?[place] ? ?

## 2021-09-21 NOTE — Patient Instructions (Signed)
Continue the medicine. ? ?Follow up in 3-6 months. ? ?Dr. Lacinda Axon  ?

## 2021-09-22 NOTE — Telephone Encounter (Signed)
Prior authorization submitted via [email or web address] decision ?

## 2021-09-22 NOTE — Telephone Encounter (Signed)
Called number for [employer] Rx Benefits [phone]. They stated that prior authorizations could not be completed via telephone and must be submitted thru [email or web address] ?

## 2021-09-23 NOTE — Telephone Encounter (Signed)
Insurance denied Jason [name] and stated the medication was a plan exclusion and not covered by benefit plan ?

## 2022-01-26 ENCOUNTER — Ambulatory Visit (INDEPENDENT_AMBULATORY_CARE_PROVIDER_SITE_OTHER): Payer: BC Managed Care – PPO | Admitting: Family Medicine

## 2022-01-26 ENCOUNTER — Ambulatory Visit: Payer: Self-pay | Admitting: Family Medicine

## 2022-01-26 VITALS — BP 138/82 | HR 78 | Ht 70.25 in | Wt 381.0 lb

## 2022-01-26 DIAGNOSIS — F32A Depression, unspecified: Secondary | ICD-10-CM

## 2022-01-26 DIAGNOSIS — F419 Anxiety disorder, unspecified: Secondary | ICD-10-CM

## 2022-01-26 DIAGNOSIS — M1A9XX0 Chronic gout, unspecified, without tophus (tophi): Secondary | ICD-10-CM | POA: Diagnosis not present

## 2022-01-26 DIAGNOSIS — M109 Gout, unspecified: Secondary | ICD-10-CM | POA: Insufficient documentation

## 2022-01-26 DIAGNOSIS — R0683 Snoring: Secondary | ICD-10-CM

## 2022-01-26 MED ORDER — COLCHICINE 0.6 MG PO TABS: 0.6000 mg | ORAL_TABLET | Freq: Every day | ORAL | 0 refills | Status: DC

## 2022-01-26 MED ORDER — BUPROPION HCL ER (XL) 300 MG PO TB24: 300.0000 mg | ORAL_TABLET | Freq: Every day | ORAL | 1 refills | Status: DC

## 2022-01-26 MED ORDER — ALLOPURINOL 100 MG PO TABS: 100.0000 mg | ORAL_TABLET | Freq: Every day | ORAL | 1 refills | Status: DC

## 2022-01-26 MED ORDER — TRAZODONE HCL 50 MG PO TABS: 25.0000 mg | ORAL_TABLET | Freq: Every evening | ORAL | 3 refills | Status: DC | PRN

## 2022-01-26 NOTE — Assessment & Plan Note (Signed)
Referral placed for sleep study.  This may be contributing to his fatigue.

## 2022-01-26 NOTE — Patient Instructions (Signed)
Medication as prescribed.  Follow up in 3 months (sooner if needed).  Take care  Dr. Adriana Simas

## 2022-01-26 NOTE — Assessment & Plan Note (Signed)
Starting on allopurinol and colchicine.

## 2022-01-26 NOTE — Progress Notes (Signed)
Subjective:  Patient ID: Jason [name], male    DOB: 01-15-1991  Age: 31 y.o. MRN: [id number]  CC: Chief Complaint  Patient presents with   Depression    Follow up  Insurance would not cover weight loss injectable    HPI:  31 year old male presents for follow-up.  Patient continues to struggle.  He has had some recent stress at work.  He has also recently separated from his significant other.  He endorses that he is quite fatigued.  He has a little energy to do things.  He is having difficulty sleeping as well.  Patient states that he has some motivation to do things but just cannot seem to make himself do it.  His main concern is the lack of energy and poor sleep.  He states that he does snore.  He has never been diagnosed with sleep apnea but states that this has been a concern in the past.  He is currently on Wellbutrin 150 mg daily.  Patient also endorses a history of gout.  He states that he had a recent flare.  He would like to be on prophylactic medication.  Patient Active Problem List   Diagnosis Date Noted   Snoring 01/26/2022   Gout 01/26/2022   Class 3 severe obesity due to excess calories without serious comorbidity with body mass index (BMI) of 50.0 to 59.9 in adult [facility]) 07/26/2019   Essential hypertension 07/22/2016   Anxiety and depression 10/30/2012    Social Hx   Social History   Socioeconomic History   Marital status: Single    Spouse name: Not on file   Number of children: Not on file   Years of education: Not on file   Highest education level: Not on file  Occupational History   Not on file  Tobacco Use   Smoking status: Never   Smokeless tobacco: Never  Substance and Sexual Activity   Alcohol use: Yes    Alcohol/week: 0.0 standard drinks of alcohol    Comment: occasional   Drug use: No   Sexual activity: Not on file  Other Topics Concern   Not on file  Social History Narrative   Not on file   Social Determinants of Health   Financial  Resource Strain: Not on file  Food Insecurity: Not on file  Transportation Needs: Not on file  Physical Activity: Not on file  Stress: Not on file  Social Connections: Not on file    Review of Systems Per HPI  Objective:  BP 138/82   Pulse 78   Ht 5' 10.25" (1.784 m)   Wt (!) 381 lb (172.8 kg)   BMI 54.28 kg/m      01/26/2022    1:53 PM 09/21/2021    1:05 PM 08/24/2021   10:01 AM  BP/Weight  Systolic BP 138 135 143  Diastolic BP 82 95 96  Wt. (Lbs) 381 373 372  BMI 54.28 kg/m2 53.14 kg/m2 53 kg/m2    Physical Exam Constitutional:      General: He is not in acute distress. HENT:     Head: Normocephalic and atraumatic.  Eyes:     General:        Right eye: No discharge.        Left eye: No discharge.     Conjunctiva/sclera: Conjunctivae normal.  Pulmonary:     Effort: Pulmonary effort is normal. No respiratory distress.  Neurological:     Mental Status: He is alert.  Psychiatric:  Mood and Affect: Mood normal.        Behavior: Behavior normal.     Lab Results  Component Value Date   WBC 10.5 08/24/2021   HGB 16.6 08/24/2021   HCT 49.0 08/24/2021   PLT 302 08/24/2021   GLUCOSE 92 08/24/2021   CHOL 107 08/24/2021   TRIG 123 08/24/2021   HDL 31 (L) 08/24/2021   LDLCALC 54 08/24/2021   ALT 35 08/24/2021   AST 23 08/24/2021   NA 140 08/24/2021   K 5.0 08/24/2021   CL 101 08/24/2021   CREATININE 1.18 08/24/2021   BUN 12 08/24/2021   CO2 24 08/24/2021   TSH 1.760 07/26/2019   HGBA1C 5.5 08/24/2021     Assessment & Plan:   Problem List Items Addressed This Visit       Other   Anxiety and depression - Primary    Worsening.  Increasing Wellbutrin. Trazodone as needed for sleep.      Relevant Medications   buPROPion (WELLBUTRIN XL) 300 MG 24 hr tablet   traZODone (DESYREL) 50 MG tablet   Gout    Starting on allopurinol and colchicine.      Snoring    Referral placed for sleep study.  This may be contributing to his fatigue.       Relevant Orders   Ambulatory referral to Sleep Studies    Meds ordered this encounter  Medications   allopurinol (ZYLOPRIM) 100 MG tablet    Sig: Take 1 tablet (100 mg total) by mouth daily.    Dispense:  90 tablet    Refill:  1   colchicine 0.6 MG tablet    Sig: Take 1 tablet (0.6 mg total) by mouth daily.    Dispense:  90 tablet    Refill:  0   buPROPion (WELLBUTRIN XL) 300 MG 24 hr tablet    Sig: Take 1 tablet (300 mg total) by mouth daily.    Dispense:  90 tablet    Refill:  1   traZODone (DESYREL) 50 MG tablet    Sig: Take 0.5-1 tablets (25-50 mg total) by mouth at bedtime as needed for sleep.    Dispense:  30 tablet    Refill:  3    Follow-up:  Return in about 3 months (around 04/28/2022).  Everlene Other DO [facility]

## 2022-01-26 NOTE — Assessment & Plan Note (Addendum)
Worsening.  Increasing Wellbutrin. Trazodone as needed for sleep.

## 2022-02-03 ENCOUNTER — Telehealth: Payer: Self-pay | Admitting: Family Medicine

## 2022-02-03 NOTE — Telephone Encounter (Signed)
Pt calling in to office and states that Colchicine is over $400. Pt is wanting to know if something else could be called in. Pt is currently having a flare up in knee. Please advise. Thank you.  [facility] [address]

## 2022-02-04 ENCOUNTER — Other Ambulatory Visit: Payer: Self-pay | Admitting: Family Medicine

## 2022-02-04 MED ORDER — NAPROXEN 500 MG PO TABS: 500.0000 mg | ORAL_TABLET | Freq: Two times a day (BID) | ORAL | 0 refills | Status: DC

## 2022-02-04 NOTE — Telephone Encounter (Signed)
Pt contacted and verbalized understanding.  

## 2022-06-19 DIAGNOSIS — Z6841 Body Mass Index (BMI) 40.0 and over, adult: Secondary | ICD-10-CM | POA: Diagnosis not present

## 2022-06-19 DIAGNOSIS — J101 Influenza due to other identified influenza virus with other respiratory manifestations: Secondary | ICD-10-CM | POA: Diagnosis not present

## 2022-06-19 DIAGNOSIS — R030 Elevated blood-pressure reading, without diagnosis of hypertension: Secondary | ICD-10-CM | POA: Diagnosis not present

## 2022-06-23 DIAGNOSIS — Z6841 Body Mass Index (BMI) 40.0 and over, adult: Secondary | ICD-10-CM | POA: Diagnosis not present

## 2022-06-23 DIAGNOSIS — J45901 Unspecified asthma with (acute) exacerbation: Secondary | ICD-10-CM | POA: Diagnosis not present

## 2022-06-23 DIAGNOSIS — R030 Elevated blood-pressure reading, without diagnosis of hypertension: Secondary | ICD-10-CM | POA: Diagnosis not present

## 2022-08-16 ENCOUNTER — Emergency Department (HOSPITAL_COMMUNITY): Payer: BC Managed Care – PPO

## 2022-08-16 ENCOUNTER — Encounter (HOSPITAL_COMMUNITY): Payer: Self-pay | Admitting: Emergency Medicine

## 2022-08-16 ENCOUNTER — Other Ambulatory Visit: Payer: Self-pay

## 2022-08-16 ENCOUNTER — Emergency Department (HOSPITAL_COMMUNITY)
Admission: EM | Admit: 2022-08-16 | Discharge: 2022-08-16 | Disposition: A | Discharge status: Home / Self Care | Payer: BC Managed Care – PPO | Attending: Emergency Medicine | Admitting: Emergency Medicine

## 2022-08-16 DIAGNOSIS — R0602 Shortness of breath: Secondary | ICD-10-CM | POA: Insufficient documentation

## 2022-08-16 DIAGNOSIS — M25572 Pain in left ankle and joints of left foot: Secondary | ICD-10-CM | POA: Insufficient documentation

## 2022-08-16 DIAGNOSIS — R079 Chest pain, unspecified: Secondary | ICD-10-CM | POA: Diagnosis not present

## 2022-08-16 DIAGNOSIS — S99912A Unspecified injury of left ankle, initial encounter: Secondary | ICD-10-CM | POA: Diagnosis not present

## 2022-08-16 DIAGNOSIS — M7989 Other specified soft tissue disorders: Secondary | ICD-10-CM | POA: Diagnosis not present

## 2022-08-16 DIAGNOSIS — J45909 Unspecified asthma, uncomplicated: Secondary | ICD-10-CM | POA: Insufficient documentation

## 2022-08-16 DIAGNOSIS — I10 Essential (primary) hypertension: Secondary | ICD-10-CM | POA: Diagnosis not present

## 2022-08-16 DIAGNOSIS — R0789 Other chest pain: Secondary | ICD-10-CM | POA: Diagnosis not present

## 2022-08-16 MED ORDER — PREDNISONE 20 MG PO TABS: 40.0000 mg | ORAL_TABLET | Freq: Every day | ORAL | 0 refills | Status: AC

## 2022-08-16 MED ORDER — OXYCODONE HCL 5 MG PO TABS
10.0000 mg | ORAL_TABLET | Freq: Once | ORAL | Status: AC
  Administered 2022-08-16: 10 mg via ORAL
  Filled 2022-08-16: qty 2

## 2022-08-16 MED ORDER — DEXAMETHASONE SODIUM PHOSPHATE 10 MG/ML IJ SOLN
10.0000 mg | Freq: Once | INTRAMUSCULAR | Status: AC
  Administered 2022-08-16: 10 mg via INTRAMUSCULAR
  Filled 2022-08-16: qty 1

## 2022-08-16 NOTE — ED Provider Notes (Signed)
[facility] Emergency Department Provider Note MRN:  [id number]  Arrival date & time: 08/16/22     Chief Complaint   Ankle pain History of Present Illness   Jason [name] is a 32 y.o. year-old male with a history of anxiety, gout presenting to the ED with chief complaint of ankle pain.  Patient explains that he rolled his ankle a few days ago and then slowly began feeling a tightness in the ankle that he has experienced multiple times in the past, felt similar to early gout flare.  Much worse pain today but he still went to work and this made it much worse.  Does not want to move the ankle at all due to the pain.  Feeling very anxious and having some shortness of breath as well.  No chest pain.  No abdominal pain.  Review of Systems  A thorough review of systems was obtained and all systems are negative except as noted in the HPI and PMH.   Patient's Health History    Past Medical History:  Diagnosis Date   Anxiety    Asthma    Depression    Gout    Hyperinsulinemia    Hypertension    Migraine with aura    Morbid obesity (HCC)     Past Surgical History:  Procedure Laterality Date   TONSILLECTOMY      Family History  Problem Relation Age of Onset   Hypertension Father    Hypertension Maternal Grandfather    Hypertension Paternal Grandmother    Hypertension Brother     Social History   Socioeconomic History   Marital status: Single    Spouse name: Not on file   Number of children: Not on file   Years of education: Not on file   Highest education level: Not on file  Occupational History   Not on file  Tobacco Use   Smoking status: Never   Smokeless tobacco: Never  Substance and Sexual Activity   Alcohol use: Yes    Alcohol/week: 0.0 standard drinks of alcohol    Comment: occasional   Drug use: No   Sexual activity: Not on file  Other Topics Concern   Not on file  Social History Narrative   Not on file   Social Determinants of  Health   Financial Resource Strain: Not on file  Food Insecurity: Not on file  Transportation Needs: Not on file  Physical Activity: Not on file  Stress: Not on file  Social Connections: Not on file  Intimate Partner Violence: Not on file     Physical Exam   Vitals:   08/16/22 0014  BP: (!) 143/97  Pulse: 100  Resp: 18  Temp: 99.1 F (37.3 C)  SpO2: 95%    CONSTITUTIONAL: Well-appearing, mildly anxious NEURO/PSYCH:  Alert and oriented x 3, no focal deficits EYES:  eyes equal and reactive ENT/NECK:  no LAD, no JVD CARDIO: Regular rate, well-perfused, normal S1 and S2 PULM:  CTAB no wheezing or rhonchi GI/GU:  non-distended, non-tender MSK/SPINE:  No gross deformities, no edema SKIN:  no rash, atraumatic   *Additional and/or pertinent findings included in MDM below  Diagnostic and Interventional Summary    EKG Interpretation  Date/Time:  Tuesday August 16 2022 00:13:06 EST Ventricular Rate:  101 PR Interval:  127 QRS Duration: 82 QT Interval:  331 QTC Calculation: 429 R Axis:   35 Text Interpretation: Sinus tachycardia Baseline wander in lead(s) III Confirmed by Gerlene Fee [phone]) on  08/16/2022 12:19:21 AM       Labs Reviewed - No data to display  DG Ankle Complete Left  Final Result    DG Chest 2 View  Final Result      Medications  dexamethasone (DECADRON) injection 10 mg (10 mg Intramuscular Given 08/16/22 0041)  oxyCODONE (Oxy IR/ROXICODONE) immediate release tablet 10 mg (10 mg Oral Given 08/16/22 0042)     Procedures  /  Critical Care Procedures  ED Course and Medical Decision Making  Initial Impression and Ddx Shortness of breath favored to be anxiety related, highly doubt PE.  Suspect gout versus fracture.  Has some lateral malleolus swelling and tenderness.  No increased warmth or erythema or fever, highly doubt septic joint.  Past medical/surgical history that increases complexity of ED encounter: Gout  Interpretation of  Diagnostics I personally reviewed the EKG and my interpretation is as follows: Sinus tachycardia  X-rays are reassuring, no signs of pneumothorax or pneumonia or fracture.  Patient Reassessment and Ultimate Disposition/Management     Patient appears more comfortable, tachycardia resolved, appropriate for discharge with gout treatment.  Patient management required discussion with the following services or consulting groups:  None  Complexity of Problems Addressed Acute complicated illness or Injury  Additional Data Reviewed and Analyzed Further history obtained from: Past medical history and medications listed in the EMR  Additional Factors Impacting ED Encounter Risk Prescriptions  Barth Kirks. Sedonia Small, MD [place] [email or web address]  Final Clinical Impressions(s) / ED Diagnoses     ICD-10-CM   1. Shortness of breath  R06.02     2. Acute left ankle pain  M25.572       ED Discharge Orders          Ordered    predniSONE (DELTASONE) 20 MG tablet  Daily        08/16/22 0140             Discharge Instructions Discussed with and Provided to Patient:     Discharge Instructions      You were evaluated in the Emergency Department and after careful evaluation, we did not find any emergent condition requiring admission or further testing in the hospital.  Your exam/testing today is overall reassuring.  Your pain is likely due to gout.  Your x-rays did not show any broken bones or emergencies.  Take the prednisone medication as directed.  Please return to the Emergency Department if you experience any worsening of your condition.   Thank you for allowing [place] to be a part of your care.       Maudie Flakes, MD 08/16/22 [phone]

## 2022-08-16 NOTE — ED Triage Notes (Signed)
Pt states he is having a gout flare-up that started Saturday evening. States he cannot afford his Gout medicine. Pt also states he is having SOB and chest pain that started about an hour ago. States he "rolled his ankle L at work Saturday night"

## 2022-08-16 NOTE — Discharge Instructions (Signed)
You were evaluated in the Emergency Department and after careful evaluation, we did not find any emergent condition requiring admission or further testing in the hospital.  Your exam/testing today is overall reassuring.  Your pain is likely due to gout.  Your x-rays did not show any broken bones or emergencies.  Take the prednisone medication as directed.  Please return to the Emergency Department if you experience any worsening of your condition.   Thank you for allowing [place] to be a part of your care.

## 2022-08-17 ENCOUNTER — Telehealth: Payer: Self-pay

## 2022-08-17 NOTE — Transitions of Care (Post Inpatient/ED Visit) (Signed)
   08/17/2022  Name: Jason [name] MRN: [id number] DOB: 07/31/1990  Today's TOC FU Call Status: Today's TOC FU Call Status:: Successful TOC FU Call Competed TOC FU Call Complete Date: 08/17/22  Transition Care Management Follow-up Telephone Call Date of Discharge: 08/16/22 Discharge [facility]: [facility] Type of Discharge: Emergency Department Reason for ED Visit: Other: (shortness of breath) How have you been since you were released from the hospital?: Better Any questions or concerns?: No  Items Reviewed: Did you receive and understand the discharge instructions provided?: Yes Medications obtained and verified?: Yes (Medications Reviewed) Any new allergies since your discharge?: No Dietary orders reviewed?: Yes Do you have support at home?: Yes People in Home: parent(s)  Home Care and Equipment/Supplies: [place] Ordered?: NA Any new equipment or medical supplies ordered?: NA  Functional Questionnaire: Do you need assistance with bathing/showering or dressing?: No Do you need assistance with meal preparation?: No Do you need assistance with eating?: No Do you have difficulty maintaining continence: No Do you need assistance with getting out of bed/getting out of a chair/moving?: No Do you have difficulty managing or taking your medications?: No  Folllow up appointments reviewed: PCP Follow-up appointment confirmed?: Yes Date of PCP follow-up appointment?: 08/18/22 Follow-up Provider: Dr [facility] Follow-up appointment confirmed?: NA Do you need transportation to your follow-up appointment?: No Do you understand care options if your condition(s) worsen?: Yes-patient verbalized understanding    [place], [place] Direct Dial [phone]

## 2022-08-18 ENCOUNTER — Ambulatory Visit (INDEPENDENT_AMBULATORY_CARE_PROVIDER_SITE_OTHER): Payer: BC Managed Care – PPO | Admitting: Family Medicine

## 2022-08-18 VITALS — BP 136/96 | HR 91 | Ht 70.25 in | Wt 393.0 lb

## 2022-08-18 DIAGNOSIS — M1A9XX0 Chronic gout, unspecified, without tophus (tophi): Secondary | ICD-10-CM

## 2022-08-18 DIAGNOSIS — Z6841 Body Mass Index (BMI) 40.0 and over, adult: Secondary | ICD-10-CM

## 2022-08-18 MED ORDER — PHENTERMINE HCL 37.5 MG PO TABS: 37.5000 mg | ORAL_TABLET | Freq: Every day | ORAL | 0 refills | Status: DC

## 2022-08-18 NOTE — Patient Instructions (Signed)
Finish the prednisone.  If you continue to have issues please let me know.  Take care  Dr. Lacinda Axon

## 2022-08-19 NOTE — Assessment & Plan Note (Signed)
Patient to complete course of prednisone.  If he continues to have issues we will extend prednisone for a longer taper.

## 2022-08-19 NOTE — Progress Notes (Signed)
Subjective:  Patient ID: Jason [name], male    DOB: May 23, 1991  Age: 32 y.o. MRN: [id number]  CC: Chief Complaint  Patient presents with   left ankle pain     Rolled ankle last Friday and progressively getting worse, panic attack and fall Monday night     HPI:  32 year old male presents for ER follow-up.  Patient states that he recently rolled his ankle.  He was seen in the ER on 2/27 for this and for shortness of breath.  The ER informed him that given his symptoms it appears that he had a panic attack.  X-ray was negative regarding fracture.  Patient was placed on prednisone for suspected gout.  He was advised to follow-up with me regarding his anxiety and shortness of breath.  Patient presents today for follow-up.  He states that his left ankle pain has improved.  He is hoping that he is going to get this to resolve once the weekend comes and he is able to rest.  Patient still struggling with depression and anxiety.  PHQ-9 score 16.  GAD-7 score of 14.  Patient states that he has a lot of stress at work.  He is trying his best to manage.  He is currently on Wellbutrin.  Patient states that overall he feels like he is doing okay however, he still lacks energy.  He states that he previously had good results with weight loss and an increase in his energy level with phentermine.  He would like to discuss this today.  Patient Active Problem List   Diagnosis Date Noted   Snoring 01/26/2022   Gout 01/26/2022   Class 3 severe obesity due to excess calories without serious comorbidity with body mass index (BMI) of 50.0 to 59.9 in adult [facility]) 07/26/2019   Essential hypertension 07/22/2016   Anxiety and depression 10/30/2012    Social Hx   Social History   Socioeconomic History   Marital status: Single    Spouse name: Not on file   Number of children: Not on file   Years of education: Not on file   Highest education level: Not on file  Occupational History   Not on file  Tobacco  Use   Smoking status: Never   Smokeless tobacco: Never  Substance and Sexual Activity   Alcohol use: Yes    Alcohol/week: 0.0 standard drinks of alcohol    Comment: occasional   Drug use: No   Sexual activity: Not on file  Other Topics Concern   Not on file  Social History Narrative   Not on file   Social Determinants of Health   Financial Resource Strain: Not on file  Food Insecurity: Not on file  Transportation Needs: Not on file  Physical Activity: Not on file  Stress: Not on file  Social Connections: Not on file    Review of Systems Per HPI  Objective:  BP (!) 136/96   Pulse 91   Ht 5' 10.25" (1.784 m)   Wt (!) 393 lb (178.3 kg)   SpO2 99%   BMI 55.99 kg/m      08/18/2022    2:54 PM 08/16/2022   12:14 AM 08/16/2022   12:12 AM  BP/Weight  Systolic BP [id number]   Diastolic BP 96 97   Wt. (Lbs) 393  380  BMI 55.99 kg/m2  54.14 kg/m2    Physical Exam Vitals and nursing note reviewed.  Constitutional:      Appearance: Normal appearance. He is obese.  Cardiovascular:     Rate and Rhythm: Normal rate and regular rhythm.  Pulmonary:     Effort: Pulmonary effort is normal.     Breath sounds: No wheezing, rhonchi or rales.  Musculoskeletal:     Comments: Left ankle with swelling.  Neurological:     Mental Status: He is alert.     Lab Results  Component Value Date   WBC 10.5 08/24/2021   HGB 16.6 08/24/2021   HCT 49.0 08/24/2021   PLT 302 08/24/2021   GLUCOSE 92 08/24/2021   CHOL 107 08/24/2021   TRIG 123 08/24/2021   HDL 31 (L) 08/24/2021   LDLCALC 54 08/24/2021   ALT 35 08/24/2021   AST 23 08/24/2021   NA 140 08/24/2021   K 5.0 08/24/2021   CL 101 08/24/2021   CREATININE 1.18 08/24/2021   BUN 12 08/24/2021   CO2 24 08/24/2021   TSH 1.760 07/26/2019   HGBA1C 5.5 08/24/2021     Assessment & Plan:   Problem List Items Addressed This Visit       Other   Gout - Primary    Patient to complete course of prednisone.  If he continues to have  issues we will extend prednisone for a longer taper.      Class 3 severe obesity due to excess calories without serious comorbidity with body mass index (BMI) of 50.0 to 59.9 in adult [facility])    We discussed the risks and benefits of phentermine.  He wants to proceed.  I advised him that I would not do this for more than 12 weeks.  Rx sent.      Relevant Medications   phentermine (ADIPEX-P) 37.5 MG tablet    Meds ordered this encounter  Medications   phentermine (ADIPEX-P) 37.5 MG tablet    Sig: Take 1 tablet (37.5 mg total) by mouth daily before breakfast.    Dispense:  90 tablet    Refill:  [place]

## 2022-08-19 NOTE — Assessment & Plan Note (Signed)
We discussed the risks and benefits of phentermine.  He wants to proceed.  I advised him that I would not do this for more than 12 weeks.  Rx sent.

## 2023-04-26 ENCOUNTER — Ambulatory Visit: Payer: Self-pay

## 2023-04-26 ENCOUNTER — Ambulatory Visit
Admission: EM | Admit: 2023-04-26 | Discharge: 2023-04-26 | Disposition: A | Discharge status: Home / Self Care | Payer: BC Managed Care – PPO | Attending: Family Medicine | Admitting: Family Medicine

## 2023-04-26 DIAGNOSIS — Z8739 Personal history of other diseases of the musculoskeletal system and connective tissue: Secondary | ICD-10-CM

## 2023-04-26 DIAGNOSIS — M25572 Pain in left ankle and joints of left foot: Secondary | ICD-10-CM

## 2023-04-26 MED ORDER — PREDNISONE 20 MG PO TABS: 40.0000 mg | ORAL_TABLET | Freq: Every day | ORAL | 0 refills | Status: DC

## 2023-04-26 NOTE — Discharge Instructions (Signed)
I have sent in a course of prednisone to help with your ankle pain.  I will call if your x-ray comes back abnormal to discuss further.  Follow-up with your primary care provider soon as possible.

## 2023-04-26 NOTE — ED Provider Notes (Signed)
[facility]    CSN: [id number] Arrival date & time: 04/26/23  1239      History   Chief Complaint No chief complaint on file.   HPI Jason [name] is a 32 y.o. male.    Presenting today with about 6 days of left ankle pain and swelling, stiffness, difficulty with motion.  Denies any known injury.  States it has felt like his typical gout flares though he has never had 1 in this ankle before.  Denies fever, discoloration, injury to the area, numbness, tingling.  Trying over-the-counter pain relievers with minimal relief.    Past Medical History:  Diagnosis Date   Anxiety    Asthma    Depression    Gout    Hyperinsulinemia    Hypertension    Migraine with aura    Morbid obesity [facility])     Patient Active Problem List   Diagnosis Date Noted   Snoring 01/26/2022   Gout 01/26/2022   Class 3 severe obesity due to excess calories without serious comorbidity with body mass index (BMI) of 50.0 to 59.9 in adult [facility]) 07/26/2019   Essential hypertension 07/22/2016   Anxiety and depression 10/30/2012    Past Surgical History:  Procedure Laterality Date   TONSILLECTOMY         Home Medications    Prior to Admission medications   Medication Sig Start Date End Date Taking? Authorizing Provider  predniSONE (DELTASONE) 20 MG tablet Take 2 tablets (40 mg total) by mouth daily with breakfast. 04/26/23  Yes Particia Nearing, PA-C  buPROPion (WELLBUTRIN XL) 300 MG 24 hr tablet Take 1 tablet (300 mg total) by mouth daily. 01/26/22   Tommie Sams, DO  colchicine 0.6 MG tablet Take 1 tablet (0.6 mg total) by mouth daily. Patient not taking: Reported on 08/17/2022 01/26/22   Tommie Sams, DO  naproxen (NAPROSYN) 500 MG tablet Take 1 tablet (500 mg total) by mouth 2 (two) times daily with a meal. 02/04/22   Tommie Sams, DO  phentermine (ADIPEX-P) 37.5 MG tablet Take 1 tablet (37.5 mg total) by mouth daily before breakfast. 08/18/22   Tommie Sams, DO  traZODone  (DESYREL) 50 MG tablet Take 0.5-1 tablets (25-50 mg total) by mouth at bedtime as needed for sleep. 01/26/22   Tommie Sams, DO    Family History Family History  Problem Relation Age of Onset   Hypertension Father    Hypertension Maternal Grandfather    Hypertension Paternal Grandmother    Hypertension Brother     Social History Social History   Tobacco Use   Smoking status: Never   Smokeless tobacco: Never  Substance Use Topics   Alcohol use: Yes    Alcohol/week: 0.0 standard drinks of alcohol    Comment: occasional   Drug use: No     Allergies   Codeine and Allopurinol   Review of Systems Review of Systems Per HPI  Physical Exam Triage Vital Signs ED Triage Vitals  Encounter Vitals Group     BP 04/26/23 1301 (!) 136/91     Systolic BP Percentile --      Diastolic BP Percentile --      Pulse --      Resp 04/26/23 1301 19     Temp 04/26/23 1301 98.4 F (36.9 C)     Temp src --      SpO2 04/26/23 1301 96 %     Weight --  Height --      Head Circumference --      Peak Flow --      Pain Score 04/26/23 1303 6     Pain Loc --      Pain Education --      Exclude from Growth Chart --    No data found.  Updated Vital Signs BP (!) 136/91 (BP Location: Right Arm)   Temp 98.4 F (36.9 C)   Resp 19   SpO2 96%   Visual Acuity Right Eye Distance:   Left Eye Distance:   Bilateral Distance:    Right Eye Near:   Left Eye Near:    Bilateral Near:     Physical Exam Vitals and nursing note reviewed.  Constitutional:      Appearance: Normal appearance.  HENT:     Head: Atraumatic.  Eyes:     Extraocular Movements: Extraocular movements intact.     Conjunctiva/sclera: Conjunctivae normal.  Cardiovascular:     Rate and Rhythm: Normal rate and regular rhythm.  Pulmonary:     Effort: Pulmonary effort is normal.     Breath sounds: Normal breath sounds.  Musculoskeletal:     Cervical back: Normal range of motion and neck supple.     Comments:  Decreased range of motion to the left ankle, diffuse tenderness to palpation.  Using a cane to help with ambulation given pain with weightbearing, ambulatory without assistance at baseline  Skin:    General: Skin is warm and dry.     Findings: No erythema.  Neurological:     General: No focal deficit present.     Mental Status: He is oriented to person, place, and time.     Comments: Left lower extremity neurovascularly intact  Psychiatric:        Mood and Affect: Mood normal.        Thought Content: Thought content normal.        Judgment: Judgment normal.      UC Treatments / Results  Labs (all labs ordered are listed, but only abnormal results are displayed) Labs Reviewed - No data to display  EKG   Radiology No results found.  Procedures Procedures (including critical care time)  Medications Ordered in UC Medications - No data to display  Initial Impression / Assessment and Plan / UC Course  I have reviewed the triage vital signs and the nursing notes.  Pertinent labs & imaging results that were available during my care of the patient were reviewed by me and considered in my medical decision making (see chart for details).     Given his history of gout and similar pain to the left ankle currently, will treat with prednisone to cover for gout flare.  He states his primary care put him on allopurinol but after the first dose he broke out in hives so he has not taken it since.  Follow-up with primary care for further management ongoing.  Follow-up sooner for worsening symptoms. Final Clinical Impressions(s) / UC Diagnoses   Final diagnoses:  Acute left ankle pain  History of gout     Discharge Instructions      I have sent in a course of prednisone to help with your ankle pain.  I will call if your x-ray comes back abnormal to discuss further.  Follow-up with your primary care provider soon as possible.    ED Prescriptions     Medication Sig Dispense Auth.  Provider   predniSONE (DELTASONE) 20 MG  tablet Take 2 tablets (40 mg total) by mouth daily with breakfast. 10 tablet Particia Nearing, [place]      PDMP not reviewed this encounter.   Particia Nearing, [place] 04/26/23 1357

## 2023-04-26 NOTE — ED Triage Notes (Signed)
Pt c/o Left ankle gout flare up x 6 days pt states he felt the discomfort this past Thursday, and by Saturday he was almost immobile, has also reports experiencing swelling to the foot, feeling feverish and having chills .

## 2023-09-25 ENCOUNTER — Ambulatory Visit: Payer: Self-pay

## 2023-09-25 NOTE — Telephone Encounter (Signed)
 Noted. appt scheduled

## 2023-09-25 NOTE — Telephone Encounter (Signed)
 Chief Complaint: Abdominal pain  Symptoms: Upper right abdominal pain 4/10, bloating, radiates to back and chest area sometimes  Frequency: Comes and goes  Pertinent Negatives: Patient denies Vomiting  Disposition: [] ED /[] Urgent Care (no appt availability in office) / [x] Appointment(In office/virtual)/ []  [place] [facility]/ [] Home Care/ [] Refused Recommended Disposition /[] [place] [facility]/ []  Follow-up with PCP Additional Notes: Patient state he has been having abdominal pain for a couple of weeks now. The pain comes and goes and is worse after eating. Patient reports feeling bloated sometimes as well. Patient states he has never had this type of pain before and it kept him awake last night so he had to call out of work today. Care advice was given and patient has been scheduled to be seen by PCP tomorrow afternoon for evaluation.   Copied from CRM [id number]. Topic: Clinical - Red Word Triage >> Sep 25, 2023  3:34 PM Albin Felling L wrote: Red Word that prompted transfer to Nurse Triage: chest pain, stomach pain, back pain Reason for Disposition  [1] MODERATE pain (e.g., interferes with normal activities) AND [2] pain comes and goes (cramps) AND [3] present > 24 hours  (Exception: Pain with Vomiting or Diarrhea - see that Guideline.)  Answer Assessment - Initial Assessment Questions 1. LOCATION: "Where does it hurt?"      Upper right side  2. RADIATION: "Does the pain shoot anywhere else?" (e.g., chest, back)     Back and chest  3. ONSET: "When did the pain begin?" (Minutes, hours or days ago)      A couple of weeks ago  4. SUDDEN: "Gradual or sudden onset?"     Gradual  5. PATTERN "Does the pain come and go, or is it constant?"    - If it comes and goes: "How long does it last?" "Do you have pain now?"     (Note: Comes and goes means the pain is intermittent. It goes away completely between bouts.)    - If constant: "Is it getting better, staying the same, or getting worse?"      (Note:  Constant means the pain never goes away completely; most serious pain is constant and gets worse.)      Comes and goes  6. SEVERITY: "How bad is the pain?"  (e.g., Scale 1-10; mild, moderate, or severe)    - MILD (1-3): Doesn't interfere with normal activities, abdomen soft and not tender to touch.     - MODERATE (4-7): Interferes with normal activities or awakens from sleep, abdomen tender to touch.     - SEVERE (8-10): Excruciating pain, doubled over, unable to do any normal activities.       4/10 7. RECURRENT SYMPTOM: "Have you ever had this type of stomach pain before?" If Yes, ask: "When was the last time?" and "What happened that time?"      No  8. CAUSE: "What do you think is causing the stomach pain?"     Unsure  9. RELIEVING/AGGRAVATING FACTORS: "What makes it better or worse?" (e.g., antacids, bending or twisting motion, bowel movement)     Eating make it worse  10. OTHER SYMPTOMS: "Do you have any other symptoms?" (e.g., back pain, diarrhea, fever, urination pain, vomiting)       Back pain, bloated  Protocols used: Abdominal Pain - Male-A-AH

## 2023-09-26 ENCOUNTER — Encounter: Payer: Self-pay | Admitting: Family Medicine

## 2023-09-26 ENCOUNTER — Ambulatory Visit: Payer: Self-pay | Admitting: Family Medicine

## 2023-09-26 VITALS — BP 154/113 | HR 74 | Temp 98.4°F | Ht 70.25 in | Wt >= 6400 oz

## 2023-09-26 DIAGNOSIS — R1011 Right upper quadrant pain: Secondary | ICD-10-CM

## 2023-09-26 MED ORDER — PANTOPRAZOLE SODIUM 40 MG PO TBEC: 40.0000 mg | DELAYED_RELEASE_TABLET | Freq: Every day | ORAL | 1 refills | Status: DC

## 2023-09-26 NOTE — Progress Notes (Signed)
 Subjective:  Patient ID: Jason [name], male    DOB: 11-28-1990  Age: 33 y.o. MRN: [id number]  CC:   Chief Complaint  Patient presents with   upper abdomen pain     Upper abdomen pain. Radiates to back x's a couple weeks.  Sometimes happens after eating food. Uncomfortable while sleeping Not feeling good last couple months.      HPI:  33 year old male presents for evaluation of the above.  2-week history of epigastric and right upper quadrant pain.  Radiates to the back.  Some nausea.  Some chest discomfort.  No vomiting.  Seems to be worse after eating.  No relieving factors.  No recent dietary changes.  No alcohol use.  Patient Active Problem List   Diagnosis Date Noted   RUQ pain 09/27/2023   Snoring 01/26/2022   Gout 01/26/2022   Class 3 severe obesity due to excess calories without serious comorbidity with body mass index (BMI) of 50.0 to 59.9 in adult [facility]) 07/26/2019   Essential hypertension 07/22/2016   Anxiety and depression 10/30/2012    Social Hx   Social History   Socioeconomic History   Marital status: Single    Spouse name: Not on file   Number of children: Not on file   Years of education: Not on file   Highest education level: Not on file  Occupational History   Not on file  Tobacco Use   Smoking status: Never   Smokeless tobacco: Never  Substance and Sexual Activity   Alcohol use: Yes    Alcohol/week: 0.0 standard drinks of alcohol    Comment: occasional   Drug use: No   Sexual activity: Not on file  Other Topics Concern   Not on file  Social History Narrative   Not on file   Social Drivers of Health   Financial Resource Strain: Not on file  Food Insecurity: Not on file  Transportation Needs: Not on file  Physical Activity: Not on file  Stress: Not on file  Social Connections: Not on file    Review of Systems Per HPI  Objective:  BP (!) 154/113   Pulse 74   Temp 98.4 F (36.9 C)   Ht 5' 10.25" (1.784 m)   Wt (!) 406 lb  (184.2 kg)   SpO2 98%   BMI 57.84 kg/m      09/26/2023    4:03 PM 04/26/2023    1:01 PM 08/18/2022    2:54 PM  BP/Weight  Systolic BP 154 136 136  Diastolic BP 113 91 96  Wt. (Lbs) 406  393  BMI 57.84 kg/m2  55.99 kg/m2    Physical Exam Vitals and nursing note reviewed.  Constitutional:      Appearance: Normal appearance. He is obese.  HENT:     Head: Normocephalic and atraumatic.  Cardiovascular:     Rate and Rhythm: Normal rate and regular rhythm.  Pulmonary:     Effort: Pulmonary effort is normal.     Breath sounds: Normal breath sounds.  Abdominal:     General: There is no distension.     Palpations: Abdomen is soft.     Comments: Epigastric and right upper quadrant tenderness to palpation.  Neurological:     Mental Status: He is alert.     Lab Results  Component Value Date   WBC 10.5 08/24/2021   HGB 16.6 08/24/2021   HCT 49.0 08/24/2021   PLT 302 08/24/2021   GLUCOSE 92 08/24/2021   CHOL  107 08/24/2021   TRIG 123 08/24/2021   HDL 31 (L) 08/24/2021   LDLCALC 54 08/24/2021   ALT 35 08/24/2021   AST 23 08/24/2021   NA 140 08/24/2021   K 5.0 08/24/2021   CL 101 08/24/2021   CREATININE 1.18 08/24/2021   BUN 12 08/24/2021   CO2 24 08/24/2021   TSH 1.760 07/26/2019   HGBA1C 5.5 08/24/2021     Assessment & Plan:  RUQ pain Assessment & Plan: Patient with epigastric and right upper quadrant tenderness to palpation.  Placing on Protonix.  Further evaluation with labs and ultrasound.  Orders: -     US ABDOMEN LIMITED RUQ (LIVER/GB) -     CBC with Differential/Platelet -     CMP14+EGFR -     Lipase  Other orders -     Pantoprazole Sodium; Take 1 tablet (40 mg total) by mouth daily.  Dispense: 90 tablet; Refill: 1    Follow-up:  Return in about 2 weeks (around 10/10/2023).  Everlene Other DO [facility]

## 2023-09-26 NOTE — Patient Instructions (Signed)
 Labs and [place] ordered.  Medication as prescribed.  We will call to schedule [place].  Take care  Dr. Adriana Simas

## 2023-09-27 ENCOUNTER — Ambulatory Visit (HOSPITAL_COMMUNITY)
Admission: RE | Admit: 2023-09-27 | Discharge: 2023-09-27 | Disposition: A | Discharge status: Home / Self Care | Source: Ambulatory Visit | Attending: Family Medicine | Admitting: Family Medicine

## 2023-09-27 ENCOUNTER — Encounter: Payer: Self-pay | Admitting: Family Medicine

## 2023-09-27 DIAGNOSIS — R1011 Right upper quadrant pain: Secondary | ICD-10-CM | POA: Diagnosis not present

## 2023-09-27 DIAGNOSIS — K760 Fatty (change of) liver, not elsewhere classified: Secondary | ICD-10-CM | POA: Diagnosis not present

## 2023-09-27 NOTE — Assessment & Plan Note (Signed)
 Patient with epigastric and right upper quadrant tenderness to palpation.  Placing on Protonix.  Further evaluation with labs and ultrasound.

## 2023-09-28 ENCOUNTER — Encounter: Payer: Self-pay | Admitting: Family Medicine

## 2023-09-28 LAB — CMP14+EGFR
ALT: 33 IU/L (ref 0–44)
AST: 26 IU/L (ref 0–40)
Albumin: 4.4 g/dL (ref 4.3–5.2)
Alkaline Phosphatase: 106 IU/L (ref 44–121)
BUN/Creatinine Ratio: 11 (ref 9–20)
BUN: 12 mg/dL (ref 6–20)
Bilirubin Total: 0.4 mg/dL (ref 0.0–1.2)
CO2: 24 mmol/L (ref 20–29)
Calcium: 9.3 mg/dL (ref 8.7–10.2)
Chloride: 103 mmol/L (ref 96–106)
Creatinine, Ser: 1.1 mg/dL (ref 0.76–1.27)
Globulin, Total: 2.7 g/dL (ref 1.5–4.5)
Glucose: 88 mg/dL (ref 70–99)
Potassium: 4.9 mmol/L (ref 3.5–5.2)
Sodium: 141 mmol/L (ref 134–144)
Total Protein: 7.1 g/dL (ref 6.0–8.5)
eGFR: 93 mL/min/{1.73_m2} (ref >59)

## 2023-09-28 LAB — CBC WITH DIFFERENTIAL/PLATELET
Basophils Absolute: 0.1 10*3/uL (ref 0.0–0.2)
Basos: 0 %
EOS (ABSOLUTE): 0.2 10*3/uL (ref 0.0–0.4)
Eos: 1 %
Hematocrit: 49 % (ref 37.5–51.0)
Hemoglobin: 16.3 g/dL (ref 13.0–17.7)
Immature Grans (Abs): 0 10*3/uL (ref 0.0–0.1)
Immature Granulocytes: 0 %
Lymphocytes Absolute: 3 10*3/uL (ref 0.7–3.1)
Lymphs: 23 %
MCH: 28.1 pg (ref 26.6–33.0)
MCHC: 33.3 g/dL (ref 31.5–35.7)
MCV: 85 fL (ref 79–97)
Monocytes Absolute: 1 10*3/uL — ABNORMAL HIGH (ref 0.1–0.9)
Monocytes: 8 %
Neutrophils Absolute: 8.9 10*3/uL — ABNORMAL HIGH (ref 1.4–7.0)
Neutrophils: 68 %
Platelets: 349 10*3/uL (ref 150–450)
RBC: 5.8 x10E6/uL (ref 4.14–5.80)
RDW: 13.1 % (ref 11.6–15.4)
WBC: 13.2 10*3/uL — ABNORMAL HIGH (ref 3.4–10.8)

## 2023-09-28 LAB — LIPASE: Lipase: 29 U/L (ref 13–78)

## 2023-09-29 ENCOUNTER — Emergency Department (HOSPITAL_COMMUNITY)

## 2023-09-29 ENCOUNTER — Encounter (HOSPITAL_COMMUNITY): Payer: Self-pay | Admitting: Emergency Medicine

## 2023-09-29 ENCOUNTER — Emergency Department (HOSPITAL_COMMUNITY)
Admission: EM | Admit: 2023-09-29 | Discharge: 2023-09-29 | Disposition: A | Discharge status: Home / Self Care | Attending: Emergency Medicine | Admitting: Emergency Medicine

## 2023-09-29 ENCOUNTER — Other Ambulatory Visit: Payer: Self-pay

## 2023-09-29 ENCOUNTER — Encounter: Payer: Self-pay | Admitting: Family Medicine

## 2023-09-29 DIAGNOSIS — K760 Fatty (change of) liver, not elsewhere classified: Secondary | ICD-10-CM | POA: Diagnosis not present

## 2023-09-29 DIAGNOSIS — R1011 Right upper quadrant pain: Secondary | ICD-10-CM | POA: Diagnosis not present

## 2023-09-29 DIAGNOSIS — K5900 Constipation, unspecified: Secondary | ICD-10-CM | POA: Diagnosis not present

## 2023-09-29 LAB — COMPREHENSIVE METABOLIC PANEL WITH GFR
ALT: 36 U/L (ref 0–44)
AST: 24 U/L (ref 15–41)
Albumin: 3.9 g/dL (ref 3.5–5.0)
Alkaline Phosphatase: 83 U/L (ref 38–126)
Anion gap: 10 (ref 5–15)
BUN: 12 mg/dL (ref 6–20)
CO2: 25 mmol/L (ref 22–32)
Calcium: 8.9 mg/dL (ref 8.9–10.3)
Chloride: 104 mmol/L (ref 98–111)
Creatinine, Ser: 1.11 mg/dL (ref 0.61–1.24)
GFR, Estimated: >60 mL/min (ref >60)
Glucose, Bld: 126 mg/dL — ABNORMAL HIGH (ref 70–99)
Potassium: 3.9 mmol/L (ref 3.5–5.1)
Sodium: 139 mmol/L (ref 135–145)
Total Bilirubin: 0.5 mg/dL (ref 0.0–1.2)
Total Protein: 7.4 g/dL (ref 6.5–8.1)

## 2023-09-29 LAB — CBC
HCT: 44 % (ref 39.0–52.0)
Hemoglobin: 15 g/dL (ref 13.0–17.0)
MCH: 28 pg (ref 26.0–34.0)
MCHC: 34.1 g/dL (ref 30.0–36.0)
MCV: 82.1 fL (ref 80.0–100.0)
Platelets: 323 10*3/uL (ref 150–400)
RBC: 5.36 MIL/uL (ref 4.22–5.81)
RDW: 12.9 % (ref 11.5–15.5)
WBC: 13.3 10*3/uL — ABNORMAL HIGH (ref 4.0–10.5)
nRBC: 0 % (ref 0.0–0.2)

## 2023-09-29 LAB — URINALYSIS, ROUTINE W REFLEX MICROSCOPIC
Bilirubin Urine: NEGATIVE
Glucose, UA: NEGATIVE mg/dL
Hgb urine dipstick: NEGATIVE
Ketones, ur: NEGATIVE mg/dL
Leukocytes,Ua: NEGATIVE
Nitrite: NEGATIVE
Protein, ur: NEGATIVE mg/dL
Specific Gravity, Urine: 1.015 (ref 1.005–1.030)
pH: 6 (ref 5.0–8.0)

## 2023-09-29 LAB — LIPASE, BLOOD: Lipase: 32 U/L (ref 11–51)

## 2023-09-29 MED ORDER — OXYCODONE-ACETAMINOPHEN 5-325 MG PO TABS: 1.0000 | ORAL_TABLET | Freq: Four times a day (QID) | ORAL | 0 refills | Status: DC | PRN

## 2023-09-29 MED ORDER — OXYCODONE-ACETAMINOPHEN 5-325 MG PO TABS
1.0000 | ORAL_TABLET | Freq: Once | ORAL | Status: AC
  Administered 2023-09-29: 1 via ORAL
  Filled 2023-09-29: qty 1

## 2023-09-29 NOTE — ED Provider Notes (Signed)
 Jason [name]   CSN: [id number] Arrival date & time: 09/29/23  1808     History  Chief Complaint  Patient presents with   Abdominal Pain    Jason [name] is a 33 y.o. male presenting for evaluation of intermittent right upper quadrant abdominal pain which has been present for the past 2 weeks.  He has been seen by his primary provider including having right upper quadrant ultrasound completed which was negative for gallstones or evidence of gallbladder pathology, but was positive for probable fatty liver.  He endorses nausea without emesis, he has had no fevers or chills.  It is sometimes triggered by meals and does radiate into his back.  Today he had a worsened episode with radiation into his left upper quadrant as well.  He denies diarrhea or significant constipation although states his last bowel movement was 2 days ago.  His pain escalated tonight hence his presentation here.  He has found no alleviators for symptoms.  He states his PCP is referring him to a GI specialist.  He was placed on a PPI when seen by his PCP 3 days ago, he has not yet started this medication. He denies acid reflux symptoms, denies EtOH intake.  The history is provided by the patient.       Home Medications Prior to Admission medications   Medication Sig Start Date End Date Taking? Authorizing Provider  oxyCODONE-acetaminophen (PERCOCET/ROXICET) 5-325 MG tablet Take 1 tablet by mouth every 6 (six) hours as needed for severe pain (pain score 7-10). 09/29/23  Yes Cashawn Yanko, PA-C  buPROPion (WELLBUTRIN XL) 300 MG 24 hr tablet Take 1 tablet (300 mg total) by mouth daily. 01/26/22   Cook, Jayce G, DO  pantoprazole (PROTONIX) 40 MG tablet Take 1 tablet (40 mg total) by mouth daily. 09/26/23   Cook, Jayce G, DO      Allergies    Codeine and Allopurinol    Review of Systems   Review of Systems  Constitutional:  Negative for fever.  HENT:  Negative for  congestion and sore throat.   Eyes: Negative.   Respiratory:  Negative for chest tightness and shortness of breath.   Cardiovascular:  Negative for chest pain.  Gastrointestinal:  Positive for abdominal pain and nausea. Negative for constipation, diarrhea and vomiting.  Genitourinary: Negative.   Musculoskeletal:  Negative for arthralgias, joint swelling and neck pain.  Skin: Negative.  Negative for rash and wound.  Neurological:  Negative for dizziness, weakness, light-headedness, numbness and headaches.  Psychiatric/Behavioral: Negative.      Physical Exam Updated Vital Signs BP 135/82   Pulse 80   Temp 98.2 F (36.8 C) (Oral)   Resp 16   Ht 5' 10.25" (1.784 m)   Wt (!) 182.8 kg   SpO2 96%   BMI 57.41 kg/m  Physical Exam Vitals and nursing [name] reviewed.  Constitutional:      Appearance: He is well-developed.  HENT:     Head: Normocephalic and atraumatic.  Eyes:     Conjunctiva/sclera: Conjunctivae normal.  Cardiovascular:     Rate and Rhythm: Normal rate and regular rhythm.     Heart sounds: Normal heart sounds.  Pulmonary:     Effort: Pulmonary effort is normal.     Breath sounds: Normal breath sounds. No wheezing.  Abdominal:     General: Bowel sounds are normal.     Palpations: Abdomen is soft.     Tenderness: There is  abdominal tenderness in the right upper quadrant. There is no guarding or rebound. Negative signs include Murphy's sign.     Hernia: No hernia is present.  Musculoskeletal:        General: Normal range of motion.     Cervical back: Normal range of motion.  Skin:    General: Skin is warm and dry.  Neurological:     Mental Status: He is alert.     ED Results / Procedures / Treatments   Labs (all labs ordered are listed, but only abnormal results are displayed) Labs Reviewed  COMPREHENSIVE METABOLIC PANEL WITH GFR - Abnormal; Notable for the following components:      Result Value   Glucose, Bld 126 (*)    All other components within  normal limits  CBC - Abnormal; Notable for the following components:   WBC 13.3 (*)    All other components within normal limits  LIPASE, BLOOD  URINALYSIS, ROUTINE W REFLEX MICROSCOPIC    EKG None  Radiology DG ABD ACUTE 2+V W 1V CHEST Result Date: 09/29/2023 CLINICAL DATA:  ruq abdominal pain,  constipation EXAM: DG ABDOMEN ACUTE WITH 1 VIEW CHEST COMPARISON:  None Available. FINDINGS: The heart and mediastinal contours are within normal limits. No focal consolidation. No pulmonary edema. No pleural effusion. No pneumothorax. No radiographic findings to suggest increased stool burden. There is no evidence of dilated bowel loops or free intraperitoneal air. No radiopaque calculi or other significant radiographic abnormality is seen. No acute osseous abnormality. IMPRESSION: 1. No acute cardiopulmonary disease. 2. Nonobstructive bowel gas pattern. Electronically Signed   By: Morgane  Naveau M.D.   On: 09/29/2023 22:49    Procedures Procedures    Medications Ordered in ED Medications  oxyCODONE-acetaminophen (PERCOCET/ROXICET) 5-325 MG per tablet 1 tablet (1 tablet Oral Given 09/29/23 2150)    ED Course/ Medical Decision Making/ A&P                                 Medical Decision Making Patient presenting with a 2-week history of intermittent right upper quadrant pain, he has had a full outpatient workup for this including labs and right upper quadrant ultrasound which was negative for gallbladder pathology, these results were reviewed.  Repeated labs tonight and remained stable as outlined below.  Differential diagnosis would include gallbladder dysfunction, probable fatty liver as mentioned in his ultrasound, GERD/PUD, constipation, small bowel obstruction, hepatic flexure gas trapping/colic.  Plain film imaging was obtained negative for significant stool burden or bowel obstruction.  His exam is reassuring, no acute abdominal findings no indication for additional imaging tonight.  He  was encouraged to start his PPI he was given oxycodone here and he states his pain was significantly improved at time of discharge.  He was given a small quantity prescription.  Advise close follow-up with his primary provider and to proceed with GI evaluation, patient may benefit from a HIDA scan which was also discussed.  Amount and/or Complexity of Data Reviewed External Data Reviewed: radiology.    Details: Recent ultrasound dated 09/27/2023 reviewed Labs: ordered.    Details: Labs are reassuring, patient has a normal lipase, normal c-Met except for a mild elevated glucose at 126 he does have a WBC count of 13.3, urinalysis is negative. Radiology: ordered.  Risk Prescription drug management.           Final Clinical Impression(s) / ED Diagnoses Final diagnoses:  RUQ abdominal pain  Fatty liver    Rx / DC Orders ED Discharge Orders          Ordered    oxyCODONE-acetaminophen (PERCOCET/ROXICET) 5-325 MG tablet  Every 6 hours PRN        09/29/23 2314              Filomeno Cromley, PA-C 09/30/23 2242    Cheyenne Cotta, MD 10/02/23 1036

## 2023-09-29 NOTE — Discharge Instructions (Addendum)
 You may take the pain medication prescribed as needed for severe pain.  Use caution with this medication as it will make you drowsy, do not drive within 4 hours of taking this medication.  As discussed try to minimize consumption of high fat containing foods as this can trigger your gallbladder to squeeze if indeed it is your gallbladder causing your pain.  You may need a further test called a HIDA scan to determine if this is the source of your symptoms.  Please discuss this with your doctor.

## 2023-09-29 NOTE — ED Triage Notes (Signed)
 Pt to ER with c/o intermittent abdominal pain for last 2 weeks.  States started in RUQ and is now radiating across to LUQ.  Pt had blood work and [place] on Tuesday. States has had some nausea, no vomiting.

## 2023-09-30 ENCOUNTER — Other Ambulatory Visit: Payer: Self-pay | Admitting: Family Medicine

## 2023-09-30 DIAGNOSIS — R1011 Right upper quadrant pain: Secondary | ICD-10-CM

## 2023-10-03 ENCOUNTER — Encounter (INDEPENDENT_AMBULATORY_CARE_PROVIDER_SITE_OTHER): Payer: Self-pay | Admitting: *Deleted

## 2023-10-10 ENCOUNTER — Encounter: Payer: Self-pay | Admitting: Family Medicine

## 2023-10-10 ENCOUNTER — Ambulatory Visit: Admitting: Family Medicine

## 2023-10-10 DIAGNOSIS — R1011 Right upper quadrant pain: Secondary | ICD-10-CM | POA: Diagnosis not present

## 2023-10-10 DIAGNOSIS — R109 Unspecified abdominal pain: Secondary | ICD-10-CM | POA: Diagnosis not present

## 2023-10-10 DIAGNOSIS — R1013 Epigastric pain: Secondary | ICD-10-CM

## 2023-10-10 NOTE — Progress Notes (Signed)
 Subjective:  Patient ID: Jason [name], male    DOB: 1990/11/28  Age: 33 y.o. MRN: [id number]  CC:   Chief Complaint  Patient presents with   Abdominal Pain    Started on R side now pain is all over abdominal area Reported diarrhea 2 days ago for 2 days now subsided    HPI:  33 year old male presents with persistent and worsening abdominal pain.  Since our last visit, he has been seen at the ER.  Workup was unremarkable.  He is having right upper quadrant pain, epigastric pain, and left-sided abdominal pain.  Worsening.  She has had some recent diarrhea but this has resolved.  No fever.  No relieving factors.  He is compliant with Protonix .  Has not yet seen GI.  Patient Active Problem List   Diagnosis Date Noted   RUQ pain 09/27/2023   Snoring 01/26/2022   Gout 01/26/2022   Class 3 severe obesity due to excess calories without serious comorbidity with body mass index (BMI) of 50.0 to 59.9 in adult [facility]) 07/26/2019   Essential hypertension 07/22/2016   Anxiety and depression 10/30/2012    Social Hx   Social History   Socioeconomic History   Marital status: Single    Spouse name: Not on file   Number of children: Not on file   Years of education: Not on file   Highest education level: Not on file  Occupational History   Not on file  Tobacco Use   Smoking status: Never   Smokeless tobacco: Never  Substance and Sexual Activity   Alcohol use: Yes    Alcohol/week: 0.0 standard drinks of alcohol    Comment: occasional   Drug use: No   Sexual activity: Not on file  Other Topics Concern   Not on file  Social History Narrative   Not on file   Social Drivers of Health   Financial Resource Strain: Not on file  Food Insecurity: Not on file  Transportation Needs: Not on file  Physical Activity: Not on file  Stress: Not on file  Social Connections: Not on file    Review of Systems Per HPI  Objective:  BP (!) 136/98   Pulse 94   Temp 97.6 F (36.4 C)   Ht  5' 10.25" (1.784 m)   Wt (!) 406 lb (184.2 kg)   SpO2 96%   BMI 57.84 kg/m      10/10/2023    3:20 PM 09/29/2023   10:00 PM 09/29/2023    6:20 PM  BP/Weight  Systolic BP 136 135   Diastolic BP 98 82   Wt. (Lbs) 406  403  BMI 57.84 kg/m2  57.41 kg/m2    Physical Exam Vitals and nursing note reviewed.  Constitutional:      General: He is not in acute distress.    Appearance: He is obese.  HENT:     Head: Normocephalic and atraumatic.  Pulmonary:     Effort: Pulmonary effort is normal. No respiratory distress.  Abdominal:     General: There is no distension.     Palpations: Abdomen is soft.     Comments: Right upper quadrant tenderness, epigastric tenderness, and left-sided abdominal tenderness to palpation.  Neurological:     Mental Status: He is alert.  Psychiatric:        Mood and Affect: Mood normal.        Behavior: Behavior normal.     Lab Results  Component Value Date   WBC  13.3 (H) 09/29/2023   HGB 15.0 09/29/2023   HCT 44.0 09/29/2023   PLT 323 09/29/2023   GLUCOSE 126 (H) 09/29/2023   CHOL 107 08/24/2021   TRIG 123 08/24/2021   HDL 31 (L) 08/24/2021   LDLCALC 54 08/24/2021   ALT 36 09/29/2023   AST 24 09/29/2023   NA 139 09/29/2023   K 3.9 09/29/2023   CL 104 09/29/2023   CREATININE 1.11 09/29/2023   BUN 12 09/29/2023   CO2 25 09/29/2023   TSH 1.760 07/26/2019   HGBA1C 5.5 08/24/2021     Assessment & Plan:  RUQ pain Assessment & Plan: Has had right upper quadrant ultrasound as well as a plain x-ray.  He continues to have severe pain despite medical treatment.  Will see if we can expedite referral to GI.  Arranging CT scan for further evaluation.  Orders: -     CT ABDOMEN PELVIS W CONTRAST  Epigastric pain -     CT ABDOMEN PELVIS W CONTRAST  Left sided abdominal pain -     CT ABDOMEN PELVIS W CONTRAST    Follow-up:  Pending CT  Harris Penton DO [facility]

## 2023-10-10 NOTE — Assessment & Plan Note (Signed)
 Has had right upper quadrant ultrasound as well as a plain x-ray.  He continues to have severe pain despite medical treatment.  Will see if we can expedite referral to GI.  Arranging CT scan for further evaluation.

## 2023-10-10 NOTE — Patient Instructions (Signed)
 CT scan on Thursday - Arrive at 830.   I will message when I hear back from Dr. Mordechai April.

## 2023-10-11 ENCOUNTER — Encounter: Payer: Self-pay | Admitting: *Deleted

## 2023-10-11 ENCOUNTER — Ambulatory Visit (INDEPENDENT_AMBULATORY_CARE_PROVIDER_SITE_OTHER): Admitting: Internal Medicine

## 2023-10-11 ENCOUNTER — Encounter: Payer: Self-pay | Admitting: Internal Medicine

## 2023-10-11 VITALS — BP 167/80 | HR 112 | Temp 98.0°F | Ht 70.25 in | Wt >= 6400 oz

## 2023-10-11 DIAGNOSIS — R110 Nausea: Secondary | ICD-10-CM

## 2023-10-11 DIAGNOSIS — R140 Abdominal distension (gaseous): Secondary | ICD-10-CM

## 2023-10-11 DIAGNOSIS — G8929 Other chronic pain: Secondary | ICD-10-CM

## 2023-10-11 DIAGNOSIS — R1013 Epigastric pain: Secondary | ICD-10-CM

## 2023-10-11 DIAGNOSIS — K30 Functional dyspepsia: Secondary | ICD-10-CM

## 2023-10-11 MED ORDER — PANTOPRAZOLE SODIUM 40 MG PO TBEC: 40.0000 mg | DELAYED_RELEASE_TABLET | Freq: Two times a day (BID) | ORAL | 11 refills | Status: DC

## 2023-10-11 NOTE — H&P (View-Only) (Signed)
 Primary Care Physician:  Cook, Jayce G, DO Primary Gastroenterologist:  Dr. Mordechai April  Chief Complaint  Patient presents with   Abdominal Pain    Referred for abdominal pain. Started about 3 - 3 and a half weeks ago. Started out as cramping pain and sometimes sharp pain. Bloating, nausea with the pain, a few days of diarrhea last week that has resolved.     HPI:   Jason [name] is a 33 y.o. male who presents to the clinic today by referral from his PCP Dr. Debrah Fan for evaluation.  Patient was in his usual state of health until approximately 2 to 3 weeks ago when he had sudden onset of epigastric abdominal pain.  States this occurred after eating grilled sausages.  This prompted ER visit, blood work largely unremarkable.  Ultrasound without evidence of cholelithiasis or cholecystitis.  Started on pantoprazole  daily which has helped with the indigestion portion though continues to have abdominal pain mild to moderate, intermittent.  Worse after meals.  Reports associated abdominal bloating as well.  No overt dysphagia odynophagia.  No melena hematochezia.  Does note 2 days of diarrhea which is since resolved.  Scheduled for CT of his abdomen pelvis tomorrow.  Past Medical History:  Diagnosis Date   Anxiety    Asthma    Depression    Gout    Hyperinsulinemia    Hypertension    Migraine with aura    Morbid obesity (HCC)     Past Surgical History:  Procedure Laterality Date   TONSILLECTOMY      Current Outpatient Medications  Medication Sig Dispense Refill   buPROPion  (WELLBUTRIN  XL) 300 MG 24 hr tablet Take 1 tablet (300 mg total) by mouth daily. 90 tablet 1   oxyCODONE -acetaminophen  (PERCOCET/ROXICET) 5-325 MG tablet Take 1 tablet by mouth every 6 (six) hours as needed for severe pain (pain score 7-10). 15 tablet 0   pantoprazole  (PROTONIX ) 40 MG tablet Take 1 tablet (40 mg total) by mouth 2 (two) times daily. 60 tablet 11   No current facility-administered medications for  this visit.    Allergies as of 10/11/2023 - Review Complete 10/11/2023  Allergen Reaction Noted   Codeine Nausea And Vomiting 01/04/2016   Allopurinol  Rash 04/26/2023    Family History  Problem Relation Age of Onset   Hypertension Father    Hypertension Maternal Grandfather    Hypertension Paternal Grandmother    Hypertension Brother     Social History   Socioeconomic History   Marital status: Single    Spouse name: Not on file   Number of children: Not on file   Years of education: Not on file   Highest education level: Not on file  Occupational History   Not on file  Tobacco Use   Smoking status: Never    Passive exposure: Past   Smokeless tobacco: Never  Substance and Sexual Activity   Alcohol use: Yes    Alcohol/week: 0.0 standard drinks of alcohol    Comment: occasional   Drug use: No   Sexual activity: Not on file  Other Topics Concern   Not on file  Social History Narrative   Not on file   Social Drivers of Health   Financial Resource Strain: Not on file  Food Insecurity: Not on file  Transportation Needs: Not on file  Physical Activity: Not on file  Stress: Not on file  Social Connections: Not on file  Intimate Partner Violence: Not on file    Subjective:  ROS     Objective: BP (!) 167/80   Pulse (!) 112   Temp 98 F (36.7 C) (Oral)   Ht 5' 10.25" (1.784 m)   Wt (!) 408 lb 14.4 oz (185.5 kg)   BMI 58.25 kg/m  Physical Exam Constitutional:      Appearance: Normal appearance.  HENT:     Head: Normocephalic and atraumatic.  Eyes:     Extraocular Movements: Extraocular movements intact.     Conjunctiva/sclera: Conjunctivae normal.  Cardiovascular:     Rate and Rhythm: Normal rate and regular rhythm.  Pulmonary:     Effort: Pulmonary effort is normal.     Breath sounds: Normal breath sounds.  Abdominal:     General: Bowel sounds are normal.     Palpations: Abdomen is soft.  Musculoskeletal:        General: Normal range of motion.      Cervical back: Normal range of motion and neck supple.  Skin:    General: Skin is warm.  Neurological:     General: No focal deficit present.     Mental Status: He is alert and oriented to person, place, and time.  Psychiatric:        Mood and Affect: Mood normal.        Behavior: Behavior normal.      Assessment: *Epigastric pain *Indigestion *Nausea *Abdominal bloating  Plan: Will schedule for EGD to evaluate for peptic ulcer disease, esophagitis, gastritis, H. Pylori, duodenitis, or other. Will also evaluate for esophageal stricture, Schatzki's ring, esophageal web or other.   The risks including infection, bleed, or perforation as well as benefits, limitations, alternatives and imponderables have been reviewed with the patient. Potential for esophageal dilation, biopsy, etc. have also been reviewed.  Questions have been answered. All parties agreeable.  I am also going to increase his pantoprazole  to twice daily.  Agree with proceeding with CT scan which is scheduled for tomorrow.  If the above workup unremarkable, may need to consider HIDA scan to further evaluate.  Thank you Dr. Debrah Fan for the kind referral   10/11/2023 5:01 PM   Disclaimer: This note was dictated with voice recognition software. Similar sounding words can inadvertently be transcribed and may not be corrected upon review.

## 2023-10-11 NOTE — Progress Notes (Signed)
 Primary Care Physician:  Cook, Jayce G, DO Primary Gastroenterologist:  Dr. Mordechai April  Chief Complaint  Patient presents with   Abdominal Pain    Referred for abdominal pain. Started about 3 - 3 and a half weeks ago. Started out as cramping pain and sometimes sharp pain. Bloating, nausea with the pain, a few days of diarrhea last week that has resolved.     HPI:   Jason [name] is a 33 y.o. male who presents to the clinic today by referral from his PCP Dr. Debrah Fan for evaluation.  Patient was in his usual state of health until approximately 2 to 3 weeks ago when he had sudden onset of epigastric abdominal pain.  States this occurred after eating grilled sausages.  This prompted ER visit, blood work largely unremarkable.  Ultrasound without evidence of cholelithiasis or cholecystitis.  Started on pantoprazole  daily which has helped with the indigestion portion though continues to have abdominal pain mild to moderate, intermittent.  Worse after meals.  Reports associated abdominal bloating as well.  No overt dysphagia odynophagia.  No melena hematochezia.  Does note 2 days of diarrhea which is since resolved.  Scheduled for CT of his abdomen pelvis tomorrow.  Past Medical History:  Diagnosis Date   Anxiety    Asthma    Depression    Gout    Hyperinsulinemia    Hypertension    Migraine with aura    Morbid obesity (HCC)     Past Surgical History:  Procedure Laterality Date   TONSILLECTOMY      Current Outpatient Medications  Medication Sig Dispense Refill   buPROPion  (WELLBUTRIN  XL) 300 MG 24 hr tablet Take 1 tablet (300 mg total) by mouth daily. 90 tablet 1   oxyCODONE -acetaminophen  (PERCOCET/ROXICET) 5-325 MG tablet Take 1 tablet by mouth every 6 (six) hours as needed for severe pain (pain score 7-10). 15 tablet 0   pantoprazole  (PROTONIX ) 40 MG tablet Take 1 tablet (40 mg total) by mouth 2 (two) times daily. 60 tablet 11   No current facility-administered medications for  this visit.    Allergies as of 10/11/2023 - Review Complete 10/11/2023  Allergen Reaction Noted   Codeine Nausea And Vomiting 01/04/2016   Allopurinol  Rash 04/26/2023    Family History  Problem Relation Age of Onset   Hypertension Father    Hypertension Maternal Grandfather    Hypertension Paternal Grandmother    Hypertension Brother     Social History   Socioeconomic History   Marital status: Single    Spouse name: Not on file   Number of children: Not on file   Years of education: Not on file   Highest education level: Not on file  Occupational History   Not on file  Tobacco Use   Smoking status: Never    Passive exposure: Past   Smokeless tobacco: Never  Substance and Sexual Activity   Alcohol use: Yes    Alcohol/week: 0.0 standard drinks of alcohol    Comment: occasional   Drug use: No   Sexual activity: Not on file  Other Topics Concern   Not on file  Social History Narrative   Not on file   Social Drivers of Health   Financial Resource Strain: Not on file  Food Insecurity: Not on file  Transportation Needs: Not on file  Physical Activity: Not on file  Stress: Not on file  Social Connections: Not on file  Intimate Partner Violence: Not on file    Subjective:  ROS     Objective: BP (!) 167/80   Pulse (!) 112   Temp 98 F (36.7 C) (Oral)   Ht 5' 10.25" (1.784 m)   Wt (!) 408 lb 14.4 oz (185.5 kg)   BMI 58.25 kg/m  Physical Exam Constitutional:      Appearance: Normal appearance.  HENT:     Head: Normocephalic and atraumatic.  Eyes:     Extraocular Movements: Extraocular movements intact.     Conjunctiva/sclera: Conjunctivae normal.  Cardiovascular:     Rate and Rhythm: Normal rate and regular rhythm.  Pulmonary:     Effort: Pulmonary effort is normal.     Breath sounds: Normal breath sounds.  Abdominal:     General: Bowel sounds are normal.     Palpations: Abdomen is soft.  Musculoskeletal:        General: Normal range of motion.      Cervical back: Normal range of motion and neck supple.  Skin:    General: Skin is warm.  Neurological:     General: No focal deficit present.     Mental Status: He is alert and oriented to person, place, and time.  Psychiatric:        Mood and Affect: Mood normal.        Behavior: Behavior normal.      Assessment: *Epigastric pain *Indigestion *Nausea *Abdominal bloating  Plan: Will schedule for EGD to evaluate for peptic ulcer disease, esophagitis, gastritis, H. Pylori, duodenitis, or other. Will also evaluate for esophageal stricture, Schatzki's ring, esophageal web or other.   The risks including infection, bleed, or perforation as well as benefits, limitations, alternatives and imponderables have been reviewed with the patient. Potential for esophageal dilation, biopsy, etc. have also been reviewed.  Questions have been answered. All parties agreeable.  I am also going to increase his pantoprazole  to twice daily.  Agree with proceeding with CT scan which is scheduled for tomorrow.  If the above workup unremarkable, may need to consider HIDA scan to further evaluate.  Thank you Dr. Debrah Fan for the kind referral   10/11/2023 5:01 PM   Disclaimer: This note was dictated with voice recognition software. Similar sounding words can inadvertently be transcribed and may not be corrected upon review.

## 2023-10-11 NOTE — Patient Instructions (Signed)
 We will schedule you for upper endoscopy to further evaluate your abdominal pain, nausea, and bloating.  I am going to increase your pantoprazole  to twice daily in the meantime.  Agree with CT scan.  I will review this once it is finalized.  If we find our answer then we can cancel upper endoscopy.  We may need to consider HIDA scan to further evaluate your gallbladder pending above workup.  It was very nice meeting you today.  Dr. Mordechai April

## 2023-10-12 ENCOUNTER — Encounter: Payer: Self-pay | Admitting: *Deleted

## 2023-10-12 ENCOUNTER — Ambulatory Visit (HOSPITAL_COMMUNITY)
Admission: RE | Admit: 2023-10-12 | Discharge: 2023-10-12 | Disposition: A | Discharge status: Home / Self Care | Source: Ambulatory Visit | Attending: Family Medicine | Admitting: Family Medicine

## 2023-10-12 ENCOUNTER — Encounter: Payer: Self-pay | Admitting: Family Medicine

## 2023-10-12 DIAGNOSIS — N2889 Other specified disorders of kidney and ureter: Secondary | ICD-10-CM | POA: Diagnosis not present

## 2023-10-12 DIAGNOSIS — R1013 Epigastric pain: Secondary | ICD-10-CM | POA: Insufficient documentation

## 2023-10-12 DIAGNOSIS — R109 Unspecified abdominal pain: Secondary | ICD-10-CM | POA: Diagnosis not present

## 2023-10-12 DIAGNOSIS — R1011 Right upper quadrant pain: Secondary | ICD-10-CM | POA: Diagnosis not present

## 2023-10-12 DIAGNOSIS — N281 Cyst of kidney, acquired: Secondary | ICD-10-CM | POA: Diagnosis not present

## 2023-10-12 DIAGNOSIS — N200 Calculus of kidney: Secondary | ICD-10-CM | POA: Diagnosis not present

## 2023-10-12 MED ORDER — IOHEXOL 9 MG/ML PO SOLN
ORAL | Status: AC
  Filled 2023-10-12: qty 1000

## 2023-10-12 MED ORDER — IOHEXOL 300 MG/ML  SOLN
100.0000 mL | Freq: Once | INTRAMUSCULAR | Status: AC | PRN
  Administered 2023-10-12: 100 mL via INTRAVENOUS

## 2023-10-12 MED ORDER — IOHEXOL 9 MG/ML PO SOLN
500.0000 mL | ORAL | Status: AC
  Administered 2023-10-12: 500 mL via ORAL

## 2023-10-19 ENCOUNTER — Ambulatory Visit (INDEPENDENT_AMBULATORY_CARE_PROVIDER_SITE_OTHER): Admitting: Gastroenterology

## 2023-10-24 NOTE — Patient Instructions (Signed)
 Jason [name]  10/24/2023     @PREFPERIOPPHARMACY @   Your procedure is scheduled on  10/26/2023.   Report to [facility] at  1045  A.M.   Call this number if you have problems the morning of surgery:  [phone]  If you experience any cold or flu symptoms such as cough, fever, chills, shortness of breath, etc. between now and your scheduled surgery, please notify us  at the above number.   Remember:  Follow the diet instructions given to you by the office.    You may drink clear liquids until 0845 am on 10/26/2023.    Clear liquids allowed are:                    Water, Juice (No red color; non-citric and without pulp; diabetics please choose diet or no sugar options), Carbonated beverages (diabetics please choose diet or no sugar options), Clear Tea (No creamer, milk, or cream, including half & half and powdered creamer), Black Coffee Only (No creamer, milk or cream, including half & half and powdered creamer), and Clear Sports drink (No red color; diabetics please choose diet or no sugar options)    Take these medicines the morning of surgery with A SIP OF WATER                 bupropion , oxycodone (if needed), pantoprazole .    Do not wear jewelry, make-up or nail polish, including gel polish,  artificial nails, or any other type of covering on natural nails (fingers and  toes).  Do not wear lotions, powders, or perfumes, or deodorant.  Do not shave 48 hours prior to surgery.  Men may shave face and neck.  Do not bring valuables to the hospital.  [facility] is not responsible for any belongings or valuables.  Contacts, dentures or bridgework may not be worn into surgery.  Leave your suitcase in the car.  After surgery it may be brought to your room.  For patients admitted to the hospital, discharge time will be determined by your treatment team.  Patients discharged the day of surgery will not be allowed to drive home and must have someone with them for 24 hours.     Special instructions:   DO NOT smoke tobacco or vape for 24 hours before your procedure.  Please read over the following fact sheets that you were given. Anesthesia Post-op Instructions and Care and Recovery After Surgery      Upper Endoscopy, Adult, Care After After the procedure, it is common to have a sore throat. It is also common to have: Mild stomach pain or discomfort. Bloating. Nausea. Follow these instructions at home: The instructions below may help you care for yourself at home. Your health care provider may give you more instructions. If you have questions, ask your health care provider. If you were given a sedative during the procedure, it can affect you for several hours. Do not drive or operate machinery until your health care provider says that it is safe. If you will be going home right after the procedure, plan to have a responsible adult: Take you home from the hospital or clinic. You will not be allowed to drive. Care for you for the time you are told. Follow instructions from your health care provider about what you may eat and drink. Return to your normal activities as told by your health care provider. Ask your health care provider what activities are safe for you.  Take over-the-counter and prescription medicines only as told by your health care provider. Contact a health care provider if you: Have a sore throat that lasts longer than one day. Have trouble swallowing. Have a fever. Get help right away if you: Vomit blood or your vomit looks like coffee grounds. Have bloody, black, or tarry stools. Have a very bad sore throat or you cannot swallow. Have difficulty breathing or very bad pain in your chest or abdomen. These symptoms may be an emergency. Get help right away. Call 911. Do not wait to see if the symptoms will go away. Do not drive yourself to the hospital. Summary After the procedure, it is common to have a sore throat, mild stomach  discomfort, bloating, and nausea. If you were given a sedative during the procedure, it can affect you for several hours. Do not drive until your health care provider says that it is safe. Follow instructions from your health care provider about what you may eat and drink. Return to your normal activities as told by your health care provider. This information is not intended to replace advice given to you by your health care provider. Make sure you discuss any questions you have with your health care provider. Document Revised: 09/15/2021 Document Reviewed: 09/15/2021 Elsevier Patient Education  2024 [employer]General Anesthesia, Adult, Care After The following information offers guidance on how to care for yourself after your procedure. Your health care provider may also give you more specific instructions. If you have problems or questions, contact your health care provider. What can I expect after the procedure? After the procedure, it is common for people to: Have pain or discomfort at the IV site. Have nausea or vomiting. Have a sore throat or hoarseness. Have trouble concentrating. Feel cold or chills. Feel weak, sleepy, or tired (fatigue). Have soreness and body aches. These can affect parts of the body that were not involved in surgery. Follow these instructions at home: For the time period you were told by your health care provider:  Rest. Do not participate in activities where you could fall or become injured. Do not drive or use machinery. Do not drink alcohol. Do not take sleeping pills or medicines that cause drowsiness. Do not make important decisions or sign legal documents. Do not take care of children on your own. General instructions Drink enough fluid to keep your urine pale yellow. If you have sleep apnea, surgery and certain medicines can increase your risk for breathing problems. Follow instructions from your health care provider about wearing your sleep  device: Anytime you are sleeping, including during daytime naps. While taking prescription pain medicines, sleeping medicines, or medicines that make you drowsy. Return to your normal activities as told by your health care provider. Ask your health care provider what activities are safe for you. Take over-the-counter and prescription medicines only as told by your health care provider. Do not use any products that contain nicotine or tobacco. These products include cigarettes, chewing tobacco, and vaping devices, such as e-cigarettes. These can delay incision healing after surgery. If you need help quitting, ask your health care provider. Contact a health care provider if: You have nausea or vomiting that does not get better with medicine. You vomit every time you eat or drink. You have pain that does not get better with medicine. You cannot urinate or have bloody urine. You develop a skin rash. You have a fever. Get help right away if: You have trouble breathing. You have chest pain.  You vomit blood. These symptoms may be an emergency. Get help right away. Call 911. Do not wait to see if the symptoms will go away. Do not drive yourself to the hospital. Summary After the procedure, it is common to have a sore throat, hoarseness, nausea, vomiting, or to feel weak, sleepy, or fatigue. For the time period you were told by your health care provider, do not drive or use machinery. Get help right away if you have difficulty breathing, have chest pain, or vomit blood. These symptoms may be an emergency. This information is not intended to replace advice given to you by your health care provider. Make sure you discuss any questions you have with your health care provider. Document Revised: 09/03/2021 Document Reviewed: 09/03/2021 Elsevier Patient Education  2024 [employer].

## 2023-10-25 ENCOUNTER — Encounter (HOSPITAL_COMMUNITY)
Admission: RE | Admit: 2023-10-25 | Discharge: 2023-10-25 | Disposition: A | Discharge status: Home / Self Care | Source: Ambulatory Visit | Attending: Internal Medicine | Admitting: Internal Medicine

## 2023-10-25 ENCOUNTER — Encounter (HOSPITAL_COMMUNITY): Payer: Self-pay

## 2023-10-25 ENCOUNTER — Other Ambulatory Visit: Payer: Self-pay

## 2023-10-25 VITALS — BP 124/80 | HR 82 | Temp 98.6°F | Resp 20 | Ht 70.25 in | Wt >= 6400 oz

## 2023-10-25 DIAGNOSIS — Z6841 Body Mass Index (BMI) 40.0 and over, adult: Secondary | ICD-10-CM | POA: Diagnosis not present

## 2023-10-25 DIAGNOSIS — R140 Abdominal distension (gaseous): Secondary | ICD-10-CM | POA: Diagnosis not present

## 2023-10-25 DIAGNOSIS — Z01810 Encounter for preprocedural cardiovascular examination: Secondary | ICD-10-CM | POA: Insufficient documentation

## 2023-10-25 DIAGNOSIS — K219 Gastro-esophageal reflux disease without esophagitis: Secondary | ICD-10-CM | POA: Diagnosis not present

## 2023-10-25 DIAGNOSIS — I10 Essential (primary) hypertension: Secondary | ICD-10-CM | POA: Insufficient documentation

## 2023-10-25 DIAGNOSIS — R110 Nausea: Secondary | ICD-10-CM | POA: Diagnosis not present

## 2023-10-25 DIAGNOSIS — Z79899 Other long term (current) drug therapy: Secondary | ICD-10-CM | POA: Diagnosis not present

## 2023-10-25 DIAGNOSIS — F418 Other specified anxiety disorders: Secondary | ICD-10-CM | POA: Diagnosis not present

## 2023-10-25 DIAGNOSIS — E66813 Obesity, class 3: Secondary | ICD-10-CM | POA: Diagnosis not present

## 2023-10-25 DIAGNOSIS — Z8249 Family history of ischemic heart disease and other diseases of the circulatory system: Secondary | ICD-10-CM | POA: Diagnosis not present

## 2023-10-25 DIAGNOSIS — K2970 Gastritis, unspecified, without bleeding: Secondary | ICD-10-CM | POA: Diagnosis not present

## 2023-10-25 DIAGNOSIS — J45909 Unspecified asthma, uncomplicated: Secondary | ICD-10-CM | POA: Diagnosis not present

## 2023-10-25 HISTORY — DX: Gastro-esophageal reflux disease without esophagitis: K21.9

## 2023-10-25 NOTE — Progress Notes (Signed)
   10/25/23 1518  OBSTRUCTIVE SLEEP APNEA  Have you ever been diagnosed with sleep apnea through a sleep study? No  Do you snore loudly (loud enough to be heard through closed doors)?  1  Do you often feel tired, fatigued, or sleepy during the daytime (such as falling asleep during driving or talking to someone)? 1  Has anyone observed you stop breathing during your sleep? 0  Do you have, or are you being treated for high blood pressure? 0  BMI more than 35 kg/m2? 1  Age > 50 (1-yes) 0  Neck circumference greater than:Male 16 inches or larger, Male 17inches or larger? 0  Male Gender (Yes=1) 1  Obstructive Sleep Apnea Score 4  Score 5 or greater  Results sent to PCP

## 2023-10-26 ENCOUNTER — Ambulatory Visit (HOSPITAL_COMMUNITY)
Admission: RE | Admit: 2023-10-26 | Discharge: 2023-10-26 | Disposition: A | Discharge status: Home / Self Care | Attending: Internal Medicine | Admitting: Internal Medicine

## 2023-10-26 ENCOUNTER — Ambulatory Visit (HOSPITAL_COMMUNITY): Admitting: Anesthesiology

## 2023-10-26 ENCOUNTER — Other Ambulatory Visit: Payer: Self-pay

## 2023-10-26 ENCOUNTER — Encounter (HOSPITAL_COMMUNITY): Payer: Self-pay | Admitting: Internal Medicine

## 2023-10-26 ENCOUNTER — Encounter (HOSPITAL_COMMUNITY)
Admission: RE | Disposition: A | Discharge status: Home / Self Care | Payer: Self-pay | Source: Home / Self Care | Attending: Internal Medicine

## 2023-10-26 DIAGNOSIS — Z8249 Family history of ischemic heart disease and other diseases of the circulatory system: Secondary | ICD-10-CM | POA: Insufficient documentation

## 2023-10-26 DIAGNOSIS — F418 Other specified anxiety disorders: Secondary | ICD-10-CM | POA: Insufficient documentation

## 2023-10-26 DIAGNOSIS — E66813 Obesity, class 3: Secondary | ICD-10-CM | POA: Insufficient documentation

## 2023-10-26 DIAGNOSIS — Z6841 Body Mass Index (BMI) 40.0 and over, adult: Secondary | ICD-10-CM | POA: Insufficient documentation

## 2023-10-26 DIAGNOSIS — Z79899 Other long term (current) drug therapy: Secondary | ICD-10-CM | POA: Diagnosis not present

## 2023-10-26 DIAGNOSIS — R140 Abdominal distension (gaseous): Secondary | ICD-10-CM | POA: Diagnosis not present

## 2023-10-26 DIAGNOSIS — J45909 Unspecified asthma, uncomplicated: Secondary | ICD-10-CM | POA: Insufficient documentation

## 2023-10-26 DIAGNOSIS — I10 Essential (primary) hypertension: Secondary | ICD-10-CM | POA: Diagnosis not present

## 2023-10-26 DIAGNOSIS — K219 Gastro-esophageal reflux disease without esophagitis: Secondary | ICD-10-CM | POA: Diagnosis not present

## 2023-10-26 DIAGNOSIS — K2970 Gastritis, unspecified, without bleeding: Secondary | ICD-10-CM | POA: Insufficient documentation

## 2023-10-26 DIAGNOSIS — K3189 Other diseases of stomach and duodenum: Secondary | ICD-10-CM | POA: Diagnosis not present

## 2023-10-26 DIAGNOSIS — R110 Nausea: Secondary | ICD-10-CM | POA: Diagnosis not present

## 2023-10-26 HISTORY — PX: ESOPHAGOGASTRODUODENOSCOPY: SHX5428

## 2023-10-26 SURGERY — EGD (ESOPHAGOGASTRODUODENOSCOPY)
Anesthesia: General

## 2023-10-26 MED ORDER — LIDOCAINE 2% (20 MG/ML) 5 ML SYRINGE
INTRAMUSCULAR | Status: DC | PRN
  Administered 2023-10-26: 100 mg via INTRAVENOUS

## 2023-10-26 MED ORDER — LACTATED RINGERS IV SOLN: INTRAVENOUS | Status: DC

## 2023-10-26 MED ORDER — GLYCOPYRROLATE PF 0.2 MG/ML IJ SOSY
PREFILLED_SYRINGE | INTRAMUSCULAR | Status: DC | PRN
Start: 2023-10-26 — End: 2023-10-26
  Administered 2023-10-26 (×2): .1 mg via INTRAVENOUS

## 2023-10-26 MED ORDER — GLYCOPYRROLATE PF 0.2 MG/ML IJ SOSY
PREFILLED_SYRINGE | INTRAMUSCULAR | Status: AC
  Filled 2023-10-26: qty 1

## 2023-10-26 MED ORDER — LACTATED RINGERS IV SOLN: INTRAVENOUS | Status: DC | PRN

## 2023-10-26 MED ORDER — LIDOCAINE 2% (20 MG/ML) 5 ML SYRINGE
INTRAMUSCULAR | Status: AC
  Filled 2023-10-26: qty 5

## 2023-10-26 MED ORDER — PROPOFOL 10 MG/ML IV BOLUS
INTRAVENOUS | Status: DC | PRN
  Administered 2023-10-26: 80 mg via INTRAVENOUS

## 2023-10-26 MED ORDER — PROPOFOL 500 MG/50ML IV EMUL
INTRAVENOUS | Status: AC
  Filled 2023-10-26: qty 50

## 2023-10-26 MED ORDER — PROPOFOL 500 MG/50ML IV EMUL
INTRAVENOUS | Status: DC | PRN
  Administered 2023-10-26: 150 ug/kg/min via INTRAVENOUS

## 2023-10-26 NOTE — Anesthesia Preprocedure Evaluation (Signed)
 Anesthesia Evaluation  Patient identified by MRN, date of birth, ID band Patient awake    Reviewed: Allergy & Precautions, H&P , NPO status , Patient's Chart, lab work & pertinent test results, reviewed documented beta blocker date and time   Airway Mallampati: II  TM Distance: >3 FB Neck ROM: full    Dental no notable dental hx.    Pulmonary asthma    Pulmonary exam normal breath sounds clear to auscultation       Cardiovascular Exercise Tolerance: Good hypertension,  Rhythm:regular Rate:Normal     Neuro/Psych  Headaches PSYCHIATRIC DISORDERS Anxiety Depression       GI/Hepatic Neg liver ROS,GERD  ,,  Endo/Other  negative endocrine ROS    Renal/GU negative Renal ROS  negative genitourinary   Musculoskeletal   Abdominal   Peds  Hematology negative hematology ROS (+)   Anesthesia Other Findings   Reproductive/Obstetrics negative OB ROS                             Anesthesia Physical Anesthesia Plan  ASA: 2  Anesthesia Plan: General   Post-op Pain Management:    Induction:   PONV Risk Score and Plan: Propofol infusion  Airway Management Planned:   Additional Equipment:   Intra-op Plan:   Post-operative Plan:   Informed Consent: I have reviewed the patients History and Physical, chart, labs and discussed the procedure including the risks, benefits and alternatives for the proposed anesthesia with the patient or authorized representative who has indicated his/her understanding and acceptance.     Dental Advisory Given  Plan Discussed with: CRNA  Anesthesia Plan Comments:        Anesthesia Quick Evaluation

## 2023-10-26 NOTE — Interval H&P Note (Signed)
 History and Physical Interval Note:  10/26/2023 12:02 PM  [name]  has presented today for surgery, with the diagnosis of abdominal pain, nausea, and bloating..  The various methods of treatment have been discussed with the patient and family. After consideration of risks, benefits and other options for treatment, the patient has consented to  Procedure(s) with comments: EGD (ESOPHAGOGASTRODUODENOSCOPY) (N/A) - 1:00 pm, asa 3 as a surgical intervention.  The patient's history has been reviewed, patient examined, no change in status, stable for surgery.  I have reviewed the patient's chart and labs.  Questions were answered to the patient's satisfaction.     Jason [name]

## 2023-10-26 NOTE — Op Note (Signed)
 [facility] Patient Name: Jason [name] Procedure Date: 10/26/2023 12:02 PM MRN: [id number] Date of Birth: 07-12-1990 Attending MD: Rolando Cliche. Mordechai April , [place], [id number] CSN: [id number] Age: 33 Admit Type: Outpatient Procedure:                Upper GI endoscopy Indications:              Abdominal bloating, Nausea Providers:                Rolando Cliche. Mordechai April, DO, Tammy Vaught, RN, Alisa App, Annell Barrow Referring MD:              Medicines:                See the Anesthesia note for documentation of the                            administered medications Complications:            No immediate complications. Estimated Blood Loss:     Estimated blood loss was minimal. Procedure:                Pre-Anesthesia Assessment:                           - The anesthesia plan was to use monitored                            anesthesia care (MAC).                           After obtaining informed consent, the endoscope was                            passed under direct vision. Throughout the                            procedure, the patient's blood pressure, pulse, and                            oxygen saturations were monitored continuously. The                            [id number]-H190 ([id number]) scope was introduced through the                            mouth, and advanced to the second part of duodenum.                            The upper GI endoscopy was accomplished without                            difficulty. The patient tolerated the procedure                            well. Scope In:  12:14:55 PM Scope Out: 12:17:47 PM Total Procedure Duration: 0 hours 2 minutes 52 seconds  Findings:      The Z-line was regular and was found 40 cm from the incisors.      Patchy mild inflammation characterized by erosions and erythema was       found in the entire examined stomach. Biopsies were taken with a cold       forceps for Helicobacter pylori testing.      The  duodenal bulb, first portion of the duodenum and second portion of       the duodenum were normal. Impression:               - Z-line regular, 40 cm from the incisors.                           - Gastritis. Biopsied.                           - Normal duodenal bulb, first portion of the                            duodenum and second portion of the duodenum. Moderate Sedation:      Per Anesthesia Care Recommendation:           - Patient has a contact number available for                            emergencies. The signs and symptoms of potential                            delayed complications were discussed with the                            patient. Return to normal activities tomorrow.                            Written discharge instructions were provided to the                            patient.                           - Resume previous diet.                           - Continue present medications.                           - Await pathology results.                           - Use a proton pump inhibitor PO BID.                           - Return to GI clinic in 6 weeks.                           -  Consider HIDA scan if symptoms not improved on                            PPI BID Procedure Code(s):        --- Professional ---                           [phone], Esophagogastroduodenoscopy, flexible,                            transoral; with biopsy, single or multiple Diagnosis Code(s):        --- Professional ---                           K29.70, Gastritis, unspecified, without bleeding                           R14.0, Abdominal distension (gaseous)                           R11.0, Nausea CPT copyright 2022 American Medical Association. All rights reserved. The codes documented in this report are preliminary and upon coder review may  be revised to meet current compliance requirements. Rolando Cliche. Mordechai April, DO Rolando Cliche. Mordechai April, DO 10/26/2023 12:21:23 PM This report has been signed  electronically. Number of Addenda: 0

## 2023-10-26 NOTE — Discharge Instructions (Addendum)
 EGD Discharge instructions Please read the instructions outlined below and refer to this sheet in the next few weeks. These discharge instructions provide you with general information on caring for yourself after you leave the hospital. Your doctor may also give you specific instructions. While your treatment has been planned according to the most current medical practices available, unavoidable complications occasionally occur. If you have any problems or questions after discharge, please call your doctor. ACTIVITY You may resume your regular activity but move at a slower pace for the next 24 hours.  Take frequent rest periods for the next 24 hours.  Walking will help expel (get rid of) the air and reduce the bloated feeling in your abdomen.  No driving for 24 hours (because of the anesthesia (medicine) used during the test).  You may shower.  Do not sign any important legal documents or operate any machinery for 24 hours (because of the anesthesia used during the test).  NUTRITION Drink plenty of fluids.  You may resume your normal diet.  Begin with a light meal and progress to your normal diet.  Avoid alcoholic beverages for 24 hours or as instructed by your caregiver.  MEDICATIONS You may resume your normal medications unless your caregiver tells you otherwise.  WHAT YOU CAN EXPECT TODAY You may experience abdominal discomfort such as a feeling of fullness or "gas" pains.  FOLLOW-UP Your doctor will discuss the results of your test with you.  SEEK IMMEDIATE MEDICAL ATTENTION IF ANY OF THE FOLLOWING OCCUR: Excessive nausea (feeling sick to your stomach) and/or vomiting.  Severe abdominal pain and distention (swelling).  Trouble swallowing.  Temperature over 101 F (37.8 C).  Rectal bleeding or vomiting of blood.   Your EGD revealed mild amount inflammation in your stomach.  I took biopsies of this to rule out infection with a bacteria called H. pylori.  Await pathology results, my  office will contact you.  Esophagus and small were normal.  Continue on pantoprazole  twice daily.  Follow-up in GI office in 4 to 6 weeks.  If symptoms not improved, we may need to consider HIDA scan to further evaluate your gallbladder.  I hope you have a great rest of your week!  Rolando Cliche. Mordechai April, D.O. Gastroenterology and Hepatology [facility]

## 2023-10-26 NOTE — Transfer of Care (Signed)
 Immediate Anesthesia Transfer of Care Note  Patient: Jason [name]  Procedure(s) Performed: EGD (ESOPHAGOGASTRODUODENOSCOPY)  Patient Location: Endoscopy Unit  Anesthesia Type:General  Level of Consciousness: drowsy and patient cooperative  Airway & Oxygen Therapy: Patient Spontanous Breathing and Patient connected to face mask oxygen  Post-op Assessment: Report given to RN and Post -op Vital signs reviewed and stable  Post vital signs: Reviewed and stable  Last Vitals:  Vitals Value Taken Time  BP 145/72 10/26/23 1223  Temp 36.8 C 10/26/23 1223  Pulse 99 10/26/23 1223  Resp 12 10/26/23 1223  SpO2 95 % 10/26/23 1223    Last Pain:  Vitals:   10/26/23 1223  TempSrc: Oral  PainSc: 0-No pain      Patients Stated Pain Goal: 10 (10/26/23 1051)  Complications: No notable events documented.

## 2023-10-27 ENCOUNTER — Encounter (HOSPITAL_COMMUNITY): Payer: Self-pay | Admitting: Internal Medicine

## 2023-10-27 LAB — SURGICAL PATHOLOGY

## 2023-11-03 NOTE — Anesthesia Postprocedure Evaluation (Signed)
 Anesthesia Post Note  Patient: Jason [name]  Procedure(s) Performed: EGD (ESOPHAGOGASTRODUODENOSCOPY)  Patient location during evaluation: Phase II Anesthesia Type: General Level of consciousness: awake Pain management: pain level controlled Vital Signs Assessment: post-procedure vital signs reviewed and stable Respiratory status: spontaneous breathing and respiratory function stable Cardiovascular status: blood pressure returned to baseline and stable Postop Assessment: no headache and no apparent nausea or vomiting Anesthetic complications: no Comments: Late entry   No notable events documented.   Last Vitals:  Vitals:   10/26/23 1051 10/26/23 1223  BP: (!) 158/101 (!) 145/72  Pulse: 90 99  Resp: 10 12  Temp: 37.1 C 36.8 C  SpO2: 97% 95%    Last Pain:  Vitals:   10/26/23 1223  TempSrc: Oral  PainSc: 0-No pain                 Coretha Dew

## 2023-11-09 ENCOUNTER — Ambulatory Visit: Payer: Self-pay | Admitting: Internal Medicine

## 2023-12-13 ENCOUNTER — Ambulatory Visit (INDEPENDENT_AMBULATORY_CARE_PROVIDER_SITE_OTHER): Admitting: Internal Medicine

## 2023-12-13 ENCOUNTER — Encounter: Payer: Self-pay | Admitting: Internal Medicine

## 2023-12-13 VITALS — BP 144/101 | HR 100 | Temp 97.8°F | Ht 72.0 in | Wt >= 6400 oz

## 2023-12-13 DIAGNOSIS — K219 Gastro-esophageal reflux disease without esophagitis: Secondary | ICD-10-CM

## 2023-12-13 DIAGNOSIS — R140 Abdominal distension (gaseous): Secondary | ICD-10-CM

## 2023-12-13 DIAGNOSIS — R110 Nausea: Secondary | ICD-10-CM

## 2023-12-13 DIAGNOSIS — K5900 Constipation, unspecified: Secondary | ICD-10-CM

## 2023-12-13 DIAGNOSIS — K30 Functional dyspepsia: Secondary | ICD-10-CM

## 2023-12-13 DIAGNOSIS — R1013 Epigastric pain: Secondary | ICD-10-CM | POA: Diagnosis not present

## 2023-12-13 NOTE — Patient Instructions (Signed)
 I am happy to hear that you are doing better.  Continue on pantoprazole  twice daily for another 6 to 8 weeks at which point you can back down to once daily and take a second dose at night as needed.  Continue on MiraLAX for your constipation.  It was very nice seeing you again today.  Follow-up in 3 to 4 months.

## 2023-12-13 NOTE — Progress Notes (Signed)
 Primary Care Physician:  Cook, Jayce G, DO Primary Gastroenterologist:  Dr. Cindie  Chief Complaint  Patient presents with   Follow-up    Patient here today for a post procedural follow up. Patient doing well since started on pantoprazole  40 mg bid.     HPI:   Jason [name] is a 33 y.o. male who presents to the clinic today for follow up visit.   Patient was in his usual state of health until March 2025 when he had sudden onset of epigastric abdominal pain.  States this occurred after eating grilled sausages.  This prompted ER visit, blood work largely unremarkable.  Ultrasound without evidence of cholelithiasis or cholecystitis.  Started on pantoprazole  daily which has helped with the indigestion portion though continues to have abdominal pain mild to moderate, intermittent.  Worse after meals.  Reports associated abdominal bloating as well.  No overt dysphagia odynophagia.  No melena hematochezia.  Does note 2 days of diarrhea which is since resolved.  CT abdomen pelvis with contrast which I personally reviewed 10/12/2023 with no acute findings, moderate amount of residual fecal material throughout the colon.  EGD 10/26/2023 regular Z-line, gastritis, normal duodenum.  Gastric biopsies negative for H. pylori.  Pantoprazole  increased to twice daily.  Recommended he go on MiraLAX as well.  Today, states his upper GI symptoms are improved as long as he takes his pantoprazole  twice daily.  Occasionally misses his nighttime dose.  Bowels are improving on MiraLAX but having issues taking this regularly as well due to his work schedule.  Past Medical History:  Diagnosis Date   Anxiety    Asthma    as child   Depression    GERD (gastroesophageal reflux disease)    Gout    Hyperinsulinemia    Hypertension    Migraine with aura    Morbid obesity (HCC)     Past Surgical History:  Procedure Laterality Date   ESOPHAGOGASTRODUODENOSCOPY N/A 10/26/2023   Procedure: EGD  (ESOPHAGOGASTRODUODENOSCOPY);  Surgeon: Cindie Carlin POUR, DO;  [facility]: [facility];  Service: Endoscopy;  Laterality: N/A;  1:00 pm, asa 3   TONSILLECTOMY     and adenoidectomy   WISDOM TOOTH EXTRACTION      Current Outpatient Medications  Medication Sig Dispense Refill   buPROPion  (WELLBUTRIN  XL) 300 MG 24 hr tablet Take 1 tablet (300 mg total) by mouth daily. 90 tablet 1   pantoprazole  (PROTONIX ) 40 MG tablet Take 1 tablet (40 mg total) by mouth 2 (two) times daily. 60 tablet 11   No current facility-administered medications for this visit.    Allergies as of 12/13/2023 - Review Complete 12/13/2023  Allergen Reaction Noted   Codeine Nausea And Vomiting 01/04/2016   Allopurinol  Rash 04/26/2023    Family History  Problem Relation Age of Onset   Hypertension Father    Hypertension Maternal Grandfather    Hypertension Paternal Grandmother    Hypertension Brother     Social History   Socioeconomic History   Marital status: Single    Spouse name: Not on file   Number of children: Not on file   Years of education: Not on file   Highest education level: Not on file  Occupational History   Not on file  Tobacco Use   Smoking status: Never    Passive exposure: Past   Smokeless tobacco: Never  Vaping Use   Vaping status: Never Used  Substance and Sexual Activity   Alcohol use: Yes  Comment: rare   Drug use: No   Sexual activity: Not on file  Other Topics Concern   Not on file  Social History Narrative   Not on file   Social Drivers of Health   Financial Resource Strain: Not on file  Food Insecurity: Not on file  Transportation Needs: Not on file  Physical Activity: Not on file  Stress: Not on file  Social Connections: Not on file  Intimate Partner Violence: Not on file    Subjective: ROS     Objective: BP (!) 165/107 (BP Location: Right Arm, Patient Position: Sitting, Cuff Size: Large)   Pulse (!) 109   Temp 97.8 F (36.6 C) (Temporal)   Ht 6'  (1.829 m)   Wt (!) 414 lb 8 oz (188 kg)   BMI 56.22 kg/m  Physical Exam Constitutional:      Appearance: Normal appearance.  HENT:     Head: Normocephalic and atraumatic.   Eyes:     Extraocular Movements: Extraocular movements intact.     Conjunctiva/sclera: Conjunctivae normal.    Cardiovascular:     Rate and Rhythm: Normal rate and regular rhythm.  Pulmonary:     Effort: Pulmonary effort is normal.     Breath sounds: Normal breath sounds.  Abdominal:     General: Bowel sounds are normal.     Palpations: Abdomen is soft.   Musculoskeletal:        General: Normal range of motion.     Cervical back: Normal range of motion and neck supple.   Skin:    General: Skin is warm.   Neurological:     General: No focal deficit present.     Mental Status: He is alert and oriented to person, place, and time.   Psychiatric:        Mood and Affect: Mood normal.        Behavior: Behavior normal.      Assessment: *Epigastric pain *Indigestion *Nausea *Abdominal bloating *Constipation  Plan: Epigastric pain and indigestion improved.  Nausea resolved.  States he will occasionally flare if he misses his nighttime dose of pantoprazole .  Recommend he take pantoprazole  twice daily for another 6 to 8 weeks at which point we can try to decrease down to once daily.  Constipation improved on MiraLAX, will continue.  Recommend he try to take this more regularly though I understand this is difficult given his work schedule.  Follow-up in 3 to 4 months.   12/13/2023 2:57 PM   Disclaimer: This note was dictated with voice recognition software. Similar sounding words can inadvertently be transcribed and may not be corrected upon review.

## 2024-01-30 DIAGNOSIS — M1000 Idiopathic gout, unspecified site: Secondary | ICD-10-CM | POA: Diagnosis not present

## 2024-03-01 ENCOUNTER — Encounter: Payer: Self-pay | Admitting: Internal Medicine

## 2024-03-18 ENCOUNTER — Ambulatory Visit: Payer: Self-pay

## 2024-03-18 NOTE — Telephone Encounter (Signed)
 FYI Only or Action Required?: Action required by provider: request for appointment and medication refill request.  Patient was last seen in primary care on 10/10/2023 by Cook, Jayce G, DO.  Called Nurse Triage reporting Medication Problem and Joint Swelling.  Symptoms began several days ago.  Interventions attempted: Nothing.  Symptoms are: gradually worsening.  Triage Disposition: See PCP When Office is Open (Within 3 Days)  Patient/caregiver understands and will follow disposition?: Yes  Copied from CRM #[id number]. Topic: Clinical - Red Word Triage >> Mar 18, 2024  1:54 PM Hamdi H wrote: Red Word that prompted transfer to Nurse Triage: Allergic reaction to gout medicine, breaks out in hives. Stopped taking his gout medicine and now has a gout flare ups. Symptoms are swollen joints, pain in ankles and knees. Reason for Disposition  [1] Swollen joint AND [2] no fever or redness  Answer Assessment - Initial Assessment Questions No available appts today. Patient reports leaving out of town today. Advised UC today. Patient requesting call back/ alternative med for Gout.  Allopurinol  causes hives and stopped taking the medication; requesting alternative medication. Took Colchicine ; medication effective, but need daily medication for gout; prescribed by [facility].  1. ONSET: When did the pain start?      Couple weeks ago 2. LOCATION: Where is the pain located?      Pain in left knee and left ankle; mostly ankle 3. PAIN: How bad is the pain?  (Scale 1-10; or mild, moderate, severe)     4/10 4. WORK OR EXERCISE: Has there been any recent work or exercise that involved this part of the body?      no 5. CAUSE: What do you think is causing the ankle pain?     gout 6. OTHER SYMPTOMS: Do you have any other symptoms? (e.g., calf pain, rash, fever, swelling)     Local swelling, more walk helps loosen joints, denies fever, rash  Protocols used: Ankle Pain-A-AH

## 2024-03-19 ENCOUNTER — Other Ambulatory Visit: Payer: Self-pay | Admitting: Family Medicine

## 2024-03-19 MED ORDER — FEBUXOSTAT 40 MG PO TABS: 40.0000 mg | ORAL_TABLET | Freq: Every day | ORAL | 1 refills | Status: DC

## 2024-03-19 MED ORDER — COLCHICINE 0.6 MG PO TABS: 0.6000 mg | ORAL_TABLET | Freq: Every day | ORAL | 1 refills | Status: AC

## 2024-03-19 NOTE — Telephone Encounter (Signed)
 Cook, Jayce G, DO      03/18/24 10:11 PM Please clarify. He has had hives with allopurinol  but tolerates colchicine ?

## 2024-03-19 NOTE — Telephone Encounter (Signed)
 Patient cannot tolerate allopurinol .  He has not had any issues with colchicine .

## 2024-04-03 ENCOUNTER — Encounter (INDEPENDENT_AMBULATORY_CARE_PROVIDER_SITE_OTHER): Payer: Self-pay | Admitting: Gastroenterology

## 2024-05-23 ENCOUNTER — Ambulatory Visit: Admitting: Nurse Practitioner

## 2024-05-23 VITALS — BP 145/101 | HR 96 | Temp 98.1°F | Ht 72.0 in | Wt >= 6400 oz

## 2024-05-23 DIAGNOSIS — R5383 Other fatigue: Secondary | ICD-10-CM

## 2024-05-23 DIAGNOSIS — F419 Anxiety disorder, unspecified: Secondary | ICD-10-CM

## 2024-05-23 DIAGNOSIS — Z79899 Other long term (current) drug therapy: Secondary | ICD-10-CM

## 2024-05-23 DIAGNOSIS — Z6841 Body Mass Index (BMI) 40.0 and over, adult: Secondary | ICD-10-CM

## 2024-05-23 DIAGNOSIS — R0683 Snoring: Secondary | ICD-10-CM

## 2024-05-23 DIAGNOSIS — I10 Essential (primary) hypertension: Secondary | ICD-10-CM

## 2024-05-23 NOTE — Patient Instructions (Signed)
 Gastric bypass Duodenal switch

## 2024-05-24 ENCOUNTER — Encounter: Payer: Self-pay | Admitting: Nurse Practitioner

## 2024-05-24 MED ORDER — AMLODIPINE BESYLATE 5 MG PO TABS: 5.0000 mg | ORAL_TABLET | Freq: Every day | ORAL | 0 refills | Status: AC

## 2024-05-24 NOTE — Progress Notes (Signed)
 Subjective:    Patient ID: Jason [name], male    DOB: 06/18/1991, 32 y.o.   MRN: [id number]  HPI Discussed the use of AI scribe software for clinical note transcription with the patient, who gave verbal consent to proceed.  History of Present Illness CAS TRACZ is a 33 year old male with hypertension who presents with elevated blood pressure and headaches.  He has experienced elevated blood pressure readings for the past three weeks, with systolic readings significantly higher than average and diastolic readings reaching 101 mmHg. His blood pressure has been generally elevated throughout the year, with occasional normal readings. He attributes the increase to stress from work and the holidays. There is a family history of hypertension, as his father had severe hypertension and heart problems.  He experiences constant headaches described as 'real bad pressure' behind his eyes and in his temples, sometimes feeling like his head is being 'squished.' These headaches are associated with nausea, but no vomiting, and are exacerbated by light sensitivity, causing him to squint. He experiences blurred vision during severe headaches. He has a history of migraines and was previously on blood pressure medication, possibly verapamil, during his teenage years.  He has struggled with weight management, having previously lost 90 pounds using phentermine , which he took for seven months. His weight has been stable but difficult to manage despite dietary changes and attempts to exercise. He works long hours and has a long commute, which contributes to his reliance on fast food, although he has recently improved his diet with the help of his girlfriend.  He experiences occasional numbness in his hands, unrelated to his headaches, and reports snoring, which may suggest sleep apnea. No smoking or vaping. He reports improved sleep quality recently. He has a history of gout. Pain affecting his joints,  particularly his ankles and knees, which he attributes to his weight.  He has a history of stomach problems, for which he was prescribed pantoprazole . He reports occasional constipation but denies acid reflux or heartburn.    Review of Systems  Constitutional:  Positive for fatigue.  Eyes:  Positive for photophobia.  Respiratory:  Negative for cough, chest tightness, shortness of breath and wheezing.   Cardiovascular:  Positive for leg swelling. Negative for chest pain.  Gastrointestinal:  Positive for constipation. Negative for abdominal pain, diarrhea, nausea and vomiting.  Neurological:  Positive for headaches. Negative for syncope, facial asymmetry, speech difficulty, weakness and numbness.  Psychiatric/Behavioral:  Positive for dysphoric mood and sleep disturbance. The patient is nervous/anxious.       05/23/2024    2:15 PM  Depression screen PHQ 2/9  Decreased Interest 1  Down, Depressed, Hopeless 1  PHQ - 2 Score 2  Altered sleeping 1  Tired, decreased energy 2  Change in appetite 1  Feeling bad or failure about yourself  2  Trouble concentrating 2  Moving slowly or fidgety/restless 1  Suicidal thoughts 0  PHQ-9 Score 11  Difficult doing work/chores Somewhat difficult      05/23/2024    2:16 PM 10/10/2023    3:32 PM 09/26/2023    4:22 PM 08/18/2022    2:58 PM  GAD 7 : Generalized Anxiety Score  Nervous, Anxious, on Edge 3 1 1 2   Control/stop worrying 2 1 1 2   Worry too much - different things 2 1 1 2   Trouble relaxing 3 1 1 2   Restless 2 1 1 2   Easily annoyed or irritable 3 1 1 2   Afraid -  awful might happen 1 1 1 2   Total GAD 7 Score 16 7 7 14   Anxiety Difficulty Very difficult Somewhat difficult Somewhat difficult Very difficult    Social History   Tobacco Use   Smoking status: Never    Passive exposure: Past   Smokeless tobacco: Never  Vaping Use   Vaping status: Never Used  Substance Use Topics   Alcohol use: Yes    Comment: rare   Drug use: No         Objective:   Physical Exam Vitals and nursing note reviewed.  Constitutional:      General: He is not in acute distress. Cardiovascular:     Rate and Rhythm: Normal rate and regular rhythm.     Heart sounds: Normal heart sounds.  Pulmonary:     Effort: Pulmonary effort is normal.     Breath sounds: Normal breath sounds.  Abdominal:     General: There is no distension.     Palpations: Abdomen is soft.     Tenderness: There is no abdominal tenderness.  Musculoskeletal:     Cervical back: Neck supple.  Skin:    General: Skin is warm and dry.  Neurological:     General: No focal deficit present.     Mental Status: He is alert and oriented to person, place, and time.     Gait: Gait normal.  Psychiatric:        Mood and Affect: Mood normal.        Behavior: Behavior normal.        Thought Content: Thought content normal.        Judgment: Judgment normal.    Today's Vitals   05/23/24 1402  BP: (!) 145/101  Pulse: 96  Temp: 98.1 F (36.7 C)  SpO2: 96%  Weight: (!) 413 lb 4 oz (187.4 kg)  Height: 6' (1.829 m)   Body mass index is 56.05 kg/m.        Assessment & Plan:  1. Essential hypertension (Primary) Hypertension exacerbated by stress and family history, causing headaches with nausea and light sensitivity. No visual changes or neurological deficits. - Prescribed blood pressure medication which will hopefully improve migraines. - Provided prescription for home blood pressure monitor with large cuff. - Instructed to monitor blood pressure and headache frequency. - Advised use of headache tracking apps for triggers. - Instructed to seek immediate care for severe symptoms like vision changes or neurological deficits. - Comprehensive metabolic panel with GFR - Lipid panel - Microalbumin / creatinine urine ratio - amLODipine  (NORVASC ) 5 MG tablet; Take 1 tablet (5 mg total) by mouth daily. For BP  Dispense: 90 tablet; Refill: 0  2. Fatigue, unspecified  type - CBC with Differential/Platelet - TSH + free T4 - Refer for evaluation for possible OSA  3. High risk medication use  - Comprehensive metabolic panel with GFR  4. Class 3 severe obesity due to excess calories without serious comorbidity with body mass index (BMI) of 50.0 to 59.9 in adult [facility]) Chronic weight management issue. Previous phentermine  use effective but unsustainable. Discussed bariatric surgery benefits and risks, including gastric bypass and duodenal switch. Emphasized comprehensive workup and insurance requirements. - Provided information on bariatric surgery options. - Encouraged exploration of bariatric surgery programs at [facility] or [facility]. - Discussed potential for weight loss injectables if sleep apnea confirmed.  5. Anxiety and depression - discussed with patient - hold on new medication since we are staring treatment for HTN. Will discuss at  his next appointment in one month.   6. Snoring Significant snoring and possible sleep apnea symptoms. May contribute to hypertension and weight issues. Discussed benefits of sleep study. - Referred for home-based sleep study. - Discussed potential for weight loss injectables if sleep apnea confirmed.  Return in about 1 month (around 06/23/2024).

## 2024-05-25 LAB — LIPID PANEL
Chol/HDL Ratio: 4.4 ratio (ref 0.0–5.0)
Cholesterol, Total: 122 mg/dL (ref 100–199)
HDL: 28 mg/dL — ABNORMAL LOW (ref >39)
LDL Chol Calc (NIH): 67 mg/dL (ref 0–99)
Triglycerides: 156 mg/dL — ABNORMAL HIGH (ref 0–149)
VLDL Cholesterol Cal: 27 mg/dL (ref 5–40)

## 2024-05-25 LAB — CBC WITH DIFFERENTIAL/PLATELET
Basophils Absolute: 0.1 x10E3/uL (ref 0.0–0.2)
Basos: 0 %
EOS (ABSOLUTE): 0.2 x10E3/uL (ref 0.0–0.4)
Eos: 1 %
Hematocrit: 48.2 % (ref 37.5–51.0)
Hemoglobin: 15.7 g/dL (ref 13.0–17.7)
Immature Grans (Abs): 0 x10E3/uL (ref 0.0–0.1)
Immature Granulocytes: 0 %
Lymphocytes Absolute: 3 x10E3/uL (ref 0.7–3.1)
Lymphs: 21 %
MCH: 28.2 pg (ref 26.6–33.0)
MCHC: 32.6 g/dL (ref 31.5–35.7)
MCV: 87 fL (ref 79–97)
Monocytes Absolute: 0.9 x10E3/uL (ref 0.1–0.9)
Monocytes: 6 %
Neutrophils Absolute: 10.5 x10E3/uL — ABNORMAL HIGH (ref 1.4–7.0)
Neutrophils: 72 %
Platelets: 348 x10E3/uL (ref 150–450)
RBC: 5.57 x10E6/uL (ref 4.14–5.80)
RDW: 12.8 % (ref 11.6–15.4)
WBC: 14.6 x10E3/uL — ABNORMAL HIGH (ref 3.4–10.8)

## 2024-05-25 LAB — COMPREHENSIVE METABOLIC PANEL WITH GFR
ALT: 58 IU/L — ABNORMAL HIGH (ref 0–44)
AST: 39 IU/L (ref 0–40)
Albumin: 4.5 g/dL (ref 4.1–5.1)
Alkaline Phosphatase: 106 IU/L (ref 47–123)
BUN/Creatinine Ratio: 12 (ref 9–20)
BUN: 13 mg/dL (ref 6–20)
Bilirubin Total: 0.6 mg/dL (ref 0.0–1.2)
CO2: 22 mmol/L (ref 20–29)
Calcium: 9.3 mg/dL (ref 8.7–10.2)
Chloride: 101 mmol/L (ref 96–106)
Creatinine, Ser: 1.11 mg/dL (ref 0.76–1.27)
Globulin, Total: 2.6 g/dL (ref 1.5–4.5)
Glucose: 99 mg/dL (ref 70–99)
Potassium: 4.1 mmol/L (ref 3.5–5.2)
Sodium: 140 mmol/L (ref 134–144)
Total Protein: 7.1 g/dL (ref 6.0–8.5)
eGFR: 91 mL/min/1.73 (ref >59)

## 2024-05-25 LAB — MICROALBUMIN / CREATININE URINE RATIO
Creatinine, Urine: 115.4 mg/dL
Microalb/Creat Ratio: 4 mg/g{creat} (ref 0–29)
Microalbumin, Urine: 4.1 ug/mL

## 2024-05-25 LAB — TSH+FREE T4
Free T4: 1.05 ng/dL (ref 0.82–1.77)
TSH: 2.33 u[IU]/mL (ref 0.450–4.500)

## 2024-05-26 ENCOUNTER — Ambulatory Visit: Payer: Self-pay | Admitting: Nurse Practitioner

## 2024-06-24 ENCOUNTER — Ambulatory Visit: Admitting: Nurse Practitioner

## 2024-07-01 ENCOUNTER — Encounter: Payer: Self-pay | Admitting: Internal Medicine

## 2024-07-01 ENCOUNTER — Ambulatory Visit: Admitting: Internal Medicine

## 2024-07-01 ENCOUNTER — Telehealth (INDEPENDENT_AMBULATORY_CARE_PROVIDER_SITE_OTHER): Payer: Self-pay

## 2024-07-01 VITALS — BP 123/89 | HR 101 | Temp 97.5°F | Ht 72.0 in | Wt >= 6400 oz

## 2024-07-01 DIAGNOSIS — R140 Abdominal distension (gaseous): Secondary | ICD-10-CM | POA: Diagnosis not present

## 2024-07-01 DIAGNOSIS — K5900 Constipation, unspecified: Secondary | ICD-10-CM

## 2024-07-01 DIAGNOSIS — K5909 Other constipation: Secondary | ICD-10-CM | POA: Diagnosis not present

## 2024-07-01 DIAGNOSIS — R1011 Right upper quadrant pain: Secondary | ICD-10-CM | POA: Diagnosis not present

## 2024-07-01 DIAGNOSIS — K30 Functional dyspepsia: Secondary | ICD-10-CM

## 2024-07-01 DIAGNOSIS — K219 Gastro-esophageal reflux disease without esophagitis: Secondary | ICD-10-CM

## 2024-07-01 MED ORDER — PANTOPRAZOLE SODIUM 40 MG PO TBEC: 40.0000 mg | DELAYED_RELEASE_TABLET | Freq: Two times a day (BID) | ORAL | 11 refills | Status: AC

## 2024-07-01 NOTE — Patient Instructions (Signed)
 I am going to check blood work today at [facility] to screen for celiac disease as well as check alpha gal syndrome.  I am also going to order a HIDA scan to further evaluate your gallbladder.  Recommend you avoid dairy x 2 weeks.  If your symptoms improve then high likelihood that you are lactose intolerant.  I have refilled your pantoprazole .  Follow-up in 2 to 3 months.  It was very nice seeing you again today.  Dr. Cindie

## 2024-07-01 NOTE — Progress Notes (Signed)
 "   Primary Care Physician:  Cook, Jayce G, DO Primary Gastroenterologist:  Dr. Cindie  Chief Complaint  Patient presents with   Follow-up    Patient here today for a follow up on Gerd. Patient says he has been having issues with bloating and gas, especially when he eats certain dairy foods. He says he has RUQ pain after eating fried or fatty foods, which he says can sometimes be pretty severe. Patient says gerd seems controlled on pantoprazole  40 mg once per day.     HPI:   Jason [name] is a 34 y.o. male who presents to the clinic today for follow up visit.   Patient was in his usual state of health until March 2025 when he had sudden onset of abdominal pain.  States this occurred after eating grilled sausages.  This prompted ER visit, blood work largely unremarkable.  Ultrasound without evidence of cholelithiasis or cholecystitis.  Started on pantoprazole  daily which has helped with the indigestion portion though continues to have abdominal pain mild to moderate, intermittent.  Worse after meals.  Reports associated abdominal bloating as well.  No overt dysphagia odynophagia.  No melena hematochezia.  Does note 2 days of diarrhea which is since resolved.  CT abdomen pelvis with contrast which I personally reviewed 10/12/2023 with no acute findings, moderate amount of residual fecal material throughout the colon.  EGD 10/26/2023 regular Z-line, gastritis, normal duodenum.  Gastric biopsies negative for H. pylori.  Pantoprazole  increased to twice daily.  Recommended he go on MiraLAX as well.  Today, states his acid reflux is improved as long as he takes his pantoprazole  although recently moved and lost his bottle of medications.  Bowels are improving on MiraLAX but having issues taking this regularly as well due to his work schedule.  Main complaint for me today is worsening right upper quadrant pain.  States this primarily occurs after meals.  Sometimes it is bad enough that he needs  to lay down.  Also notes issues with dairy.  Reports significant bloating after consumption of milk, sometimes cheese.  Past Medical History:  Diagnosis Date   Anxiety    Asthma    as child   Depression    GERD (gastroesophageal reflux disease)    Gout    Hyperinsulinemia    Hypertension    Migraine with aura    Morbid obesity (HCC)     Past Surgical History:  Procedure Laterality Date   ESOPHAGOGASTRODUODENOSCOPY N/A 10/26/2023   Procedure: EGD (ESOPHAGOGASTRODUODENOSCOPY);  Surgeon: Cindie Carlin POUR, DO;  [facility]: [facility];  Service: Endoscopy;  Laterality: N/A;  1:00 pm, asa 3   TONSILLECTOMY     and adenoidectomy   WISDOM TOOTH EXTRACTION      Current Outpatient Medications  Medication Sig Dispense Refill   acetaminophen  (TYLENOL ) 325 MG tablet Take 650 mg by mouth every 6 (six) hours as needed.     amLODipine  (NORVASC ) 5 MG tablet Take 1 tablet (5 mg total) by mouth daily. For BP 90 tablet 0   colchicine  0.6 MG tablet Take 1 tablet (0.6 mg total) by mouth daily. 90 tablet 1   ibuprofen  (ADVIL ) 200 MG tablet Take 200 mg by mouth every 6 (six) hours as needed.     pantoprazole  (PROTONIX ) 40 MG tablet Take 1 tablet (40 mg total) by mouth 2 (two) times daily. 60 tablet 11   No current facility-administered medications for this visit.    Allergies as of 07/01/2024 - Review Complete 07/01/2024  Allergen Reaction Noted   Codeine Nausea And Vomiting 01/04/2016   Allopurinol  Rash 04/26/2023    Family History  Problem Relation Age of Onset   Hypertension Father    Hypertension Maternal Grandfather    Hypertension Paternal Grandmother    Hypertension Brother     Social History   Socioeconomic History   Marital status: Single    Spouse name: Not on file   Number of children: Not on file   Years of education: Not on file   Highest education level: Not on file  Occupational History   Not on file  Tobacco Use   Smoking status: Never    Passive exposure: Past    Smokeless tobacco: Never  Vaping Use   Vaping status: Never Used  Substance and Sexual Activity   Alcohol use: Yes    Comment: rare   Drug use: No   Sexual activity: Not on file  Other Topics Concern   Not on file  Social History Narrative   Not on file   Social Drivers of Health   Tobacco Use: Low Risk (07/01/2024)   Patient History    Smoking Tobacco Use: Never    Smokeless Tobacco Use: Never    Passive Exposure: Past  Financial Resource Strain: Not on file  Food Insecurity: Not on file  Transportation Needs: Not on file  Physical Activity: Not on file  Stress: Not on file  Social Connections: Not on file  Intimate Partner Violence: Not on file  Depression (PHQ2-9): High Risk (05/23/2024)   Depression (PHQ2-9)    PHQ-2 Score: 11  Alcohol Screen: Not on file  Housing: Not on file  Utilities: Not on file  Health Literacy: Not on file    Subjective: Review of Systems  Constitutional:  Negative for chills and fever.  HENT:  Negative for congestion and hearing loss.   Eyes:  Negative for blurred vision and double vision.  Respiratory:  Negative for cough and shortness of breath.   Cardiovascular:  Negative for chest pain and palpitations.  Gastrointestinal:  Negative for abdominal pain, blood in stool, constipation, diarrhea, heartburn, melena and vomiting.  Genitourinary:  Negative for dysuria and urgency.  Musculoskeletal:  Negative for joint pain and myalgias.  Skin:  Negative for itching and rash.  Neurological:  Negative for dizziness and headaches.  Psychiatric/Behavioral:  Negative for depression. The patient is not nervous/anxious.        Objective: BP 123/89 (BP Location: Left Arm, Patient Position: Sitting, Cuff Size: Large)   Pulse (!) 101   Temp (!) 97.5 F (36.4 C) (Temporal)   Ht 6' (1.829 m)   Wt (!) 413 lb 4.8 oz (187.5 kg)   BMI 56.05 kg/m  Physical Exam Constitutional:      Appearance: Normal appearance.  HENT:     Head: Normocephalic  and atraumatic.  Eyes:     Extraocular Movements: Extraocular movements intact.     Conjunctiva/sclera: Conjunctivae normal.  Cardiovascular:     Rate and Rhythm: Normal rate and regular rhythm.  Pulmonary:     Effort: Pulmonary effort is normal.     Breath sounds: Normal breath sounds.  Abdominal:     General: Bowel sounds are normal.     Palpations: Abdomen is soft.  Musculoskeletal:        General: Normal range of motion.     Cervical back: Normal range of motion and neck supple.  Skin:    General: Skin is warm.  Neurological:  General: No focal deficit present.     Mental Status: He is alert and oriented to person, place, and time.  Psychiatric:        Mood and Affect: Mood normal.        Behavior: Behavior normal.      Assessment/Plan:  1.  Right upper quadrant abdominal pain, abdominal bloating-RUQ US  and CT abdomen pelvis largely unremarkable.  Will order HIDA scan to further evaluate for biliary colic.  Will screen for celiac disease as well as alpha gal syndrome.  2.  Chronic GERD/indigestion-improved as long as he takes his pantoprazole .  Refill today.  3.  Chronic constipation-continue on MiraLAX as needed.  TSH WNL.  Follow-up in 2 to 3 months.  07/01/2024 2:56 PM   Disclaimer: This note was dictated with voice recognition software. Similar sounding words can inadvertently be transcribed and may not be corrected upon review.  "

## 2024-07-01 NOTE — Telephone Encounter (Signed)
 Patient scheduled for HIDA scan on 07/02/2024 at 10:00am.

## 2024-07-01 NOTE — Telephone Encounter (Signed)
 PA on [facility] for HIDA: As of 06/20/2024, this members medical plan does not require prior authorization for this CPT Code.

## 2024-07-02 ENCOUNTER — Encounter (HOSPITAL_COMMUNITY)
Admission: RE | Admit: 2024-07-02 | Discharge: 2024-07-02 | Disposition: A | Discharge status: Home / Self Care | Source: Ambulatory Visit | Attending: Internal Medicine | Admitting: Internal Medicine

## 2024-07-02 ENCOUNTER — Encounter (HOSPITAL_COMMUNITY): Payer: Self-pay

## 2024-07-02 DIAGNOSIS — R1011 Right upper quadrant pain: Secondary | ICD-10-CM | POA: Diagnosis present

## 2024-07-02 DIAGNOSIS — K30 Functional dyspepsia: Secondary | ICD-10-CM | POA: Diagnosis present

## 2024-07-02 DIAGNOSIS — R140 Abdominal distension (gaseous): Secondary | ICD-10-CM | POA: Diagnosis present

## 2024-07-02 MED ORDER — TECHNETIUM TC 99M MEBROFENIN IV KIT
5.0000 | PACK | Freq: Once | INTRAVENOUS | Status: AC | PRN
  Administered 2024-07-02: 5.3 via INTRAVENOUS

## 2024-07-04 ENCOUNTER — Ambulatory Visit: Payer: Self-pay | Admitting: Internal Medicine

## 2024-07-04 LAB — CELIAC DISEASE PANEL
(tTG) Ab, IgA: <1 U/mL
(tTG) Ab, IgG: <1 U/mL
Deamidated Gliadin Abs, IgG: <1 U/mL
Gliadin IgA: 7.8 U/mL
Immunoglobulin A: 354 mg/dL — ABNORMAL HIGH (ref 47–310)

## 2024-07-04 LAB — ALPHA-GAL PANEL
Allergen, Mutton, f88: <0.1 kU/L
Allergen, Pork, f26: <0.1 kU/L
Beef: <0.1 kU/L
CLASS: 0
CLASS: 0
Class: 0
GALACTOSE-ALPHA-1,3-GALACTOSE IGE*: <0.1 kU/L

## 2024-07-04 LAB — INTERPRETATION:

## 2024-07-29 ENCOUNTER — Institutional Professional Consult (permissible substitution): Admitting: Neurology
